# Patient Record
Sex: Female | Born: 1940 | Race: White | Hispanic: No | Smoking: Never smoker
Health system: Southern US, Community
[De-identification: ages and names within clinical notes are randomized; demographics above are authoritative.]

## PROBLEM LIST (undated history)

## (undated) DIAGNOSIS — E119 Type 2 diabetes mellitus without complications: Secondary | ICD-10-CM

## (undated) DIAGNOSIS — E785 Hyperlipidemia, unspecified: Secondary | ICD-10-CM

## (undated) DIAGNOSIS — Z90711 Acquired absence of uterus with remaining cervical stump: Secondary | ICD-10-CM

## (undated) DIAGNOSIS — I10 Essential (primary) hypertension: Secondary | ICD-10-CM

## (undated) DIAGNOSIS — M1990 Unspecified osteoarthritis, unspecified site: Secondary | ICD-10-CM

## (undated) HISTORY — PX: COLONOSCOPY: SHX174

## (undated) HISTORY — DX: Acquired absence of uterus with remaining cervical stump: Z90.711

## (undated) HISTORY — DX: Hyperlipidemia, unspecified: E78.5

## (undated) HISTORY — DX: Type 2 diabetes mellitus without complications: E11.9

## (undated) HISTORY — DX: Unspecified osteoarthritis, unspecified site: M19.90

## (undated) HISTORY — PX: PARTIAL HYSTERECTOMY: SHX80

## (undated) HISTORY — PX: CHOLECYSTECTOMY: SHX55

## (undated) HISTORY — DX: Essential (primary) hypertension: I10

---

## 2013-10-19 DIAGNOSIS — L270 Generalized skin eruption due to drugs and medicaments taken internally: Secondary | ICD-10-CM | POA: Diagnosis not present

## 2013-10-19 DIAGNOSIS — L820 Inflamed seborrheic keratosis: Secondary | ICD-10-CM | POA: Diagnosis not present

## 2013-10-19 DIAGNOSIS — D485 Neoplasm of uncertain behavior of skin: Secondary | ICD-10-CM | POA: Diagnosis not present

## 2013-10-19 DIAGNOSIS — L821 Other seborrheic keratosis: Secondary | ICD-10-CM | POA: Diagnosis not present

## 2014-02-23 DIAGNOSIS — E119 Type 2 diabetes mellitus without complications: Secondary | ICD-10-CM | POA: Diagnosis not present

## 2014-02-23 DIAGNOSIS — H353190 Nonexudative age-related macular degeneration, unspecified eye, stage unspecified: Secondary | ICD-10-CM | POA: Diagnosis not present

## 2014-03-29 DIAGNOSIS — H2510 Age-related nuclear cataract, unspecified eye: Secondary | ICD-10-CM | POA: Diagnosis not present

## 2014-03-29 DIAGNOSIS — E119 Type 2 diabetes mellitus without complications: Secondary | ICD-10-CM | POA: Diagnosis not present

## 2014-03-29 DIAGNOSIS — H353190 Nonexudative age-related macular degeneration, unspecified eye, stage unspecified: Secondary | ICD-10-CM | POA: Diagnosis not present

## 2014-03-29 DIAGNOSIS — E1139 Type 2 diabetes mellitus with other diabetic ophthalmic complication: Secondary | ICD-10-CM | POA: Diagnosis not present

## 2014-05-30 DIAGNOSIS — Z130 Encounter for screening for diseases of the blood and blood-forming organs and certain disorders involving the immune mechanism: Secondary | ICD-10-CM | POA: Diagnosis not present

## 2014-05-30 DIAGNOSIS — Z1329 Encounter for screening for other suspected endocrine disorder: Secondary | ICD-10-CM | POA: Diagnosis not present

## 2014-05-30 DIAGNOSIS — E785 Hyperlipidemia, unspecified: Secondary | ICD-10-CM | POA: Diagnosis not present

## 2014-05-30 DIAGNOSIS — E119 Type 2 diabetes mellitus without complications: Secondary | ICD-10-CM | POA: Diagnosis not present

## 2014-05-30 DIAGNOSIS — Z13228 Encounter for screening for other metabolic disorders: Secondary | ICD-10-CM | POA: Diagnosis not present

## 2014-05-30 DIAGNOSIS — I10 Essential (primary) hypertension: Secondary | ICD-10-CM | POA: Diagnosis not present

## 2014-06-02 DIAGNOSIS — M545 Low back pain, unspecified: Secondary | ICD-10-CM | POA: Diagnosis not present

## 2014-06-02 DIAGNOSIS — M1610 Unilateral primary osteoarthritis, unspecified hip: Secondary | ICD-10-CM | POA: Diagnosis not present

## 2014-06-02 DIAGNOSIS — M25559 Pain in unspecified hip: Secondary | ICD-10-CM | POA: Diagnosis not present

## 2014-07-12 DIAGNOSIS — H25813 Combined forms of age-related cataract, bilateral: Secondary | ICD-10-CM | POA: Diagnosis not present

## 2014-07-12 DIAGNOSIS — H3532 Exudative age-related macular degeneration: Secondary | ICD-10-CM | POA: Diagnosis not present

## 2014-07-12 DIAGNOSIS — H353290 Exudative age-related macular degeneration, unspecified eye, stage unspecified: Secondary | ICD-10-CM | POA: Insufficient documentation

## 2014-07-13 DIAGNOSIS — L728 Other follicular cysts of the skin and subcutaneous tissue: Secondary | ICD-10-CM | POA: Diagnosis not present

## 2014-07-25 DIAGNOSIS — M25559 Pain in unspecified hip: Secondary | ICD-10-CM | POA: Insufficient documentation

## 2014-07-25 DIAGNOSIS — M1610 Unilateral primary osteoarthritis, unspecified hip: Secondary | ICD-10-CM | POA: Insufficient documentation

## 2014-07-25 DIAGNOSIS — E119 Type 2 diabetes mellitus without complications: Secondary | ICD-10-CM | POA: Insufficient documentation

## 2014-08-08 DIAGNOSIS — D126 Benign neoplasm of colon, unspecified: Secondary | ICD-10-CM | POA: Diagnosis not present

## 2014-08-08 DIAGNOSIS — Z79899 Other long term (current) drug therapy: Secondary | ICD-10-CM | POA: Diagnosis not present

## 2014-08-08 DIAGNOSIS — Z90710 Acquired absence of both cervix and uterus: Secondary | ICD-10-CM | POA: Diagnosis not present

## 2014-08-08 DIAGNOSIS — Z1211 Encounter for screening for malignant neoplasm of colon: Secondary | ICD-10-CM | POA: Diagnosis not present

## 2014-08-08 DIAGNOSIS — Z86010 Personal history of colonic polyps: Secondary | ICD-10-CM | POA: Diagnosis not present

## 2014-08-08 DIAGNOSIS — D122 Benign neoplasm of ascending colon: Secondary | ICD-10-CM | POA: Diagnosis not present

## 2014-08-08 DIAGNOSIS — Z9049 Acquired absence of other specified parts of digestive tract: Secondary | ICD-10-CM | POA: Diagnosis not present

## 2014-08-08 DIAGNOSIS — Z882 Allergy status to sulfonamides status: Secondary | ICD-10-CM | POA: Diagnosis not present

## 2014-08-08 DIAGNOSIS — Z880 Allergy status to penicillin: Secondary | ICD-10-CM | POA: Diagnosis not present

## 2014-08-08 DIAGNOSIS — D123 Benign neoplasm of transverse colon: Secondary | ICD-10-CM | POA: Diagnosis not present

## 2014-08-08 DIAGNOSIS — K6289 Other specified diseases of anus and rectum: Secondary | ICD-10-CM | POA: Diagnosis not present

## 2014-08-08 DIAGNOSIS — K5730 Diverticulosis of large intestine without perforation or abscess without bleeding: Secondary | ICD-10-CM | POA: Diagnosis not present

## 2014-08-08 DIAGNOSIS — M25559 Pain in unspecified hip: Secondary | ICD-10-CM | POA: Diagnosis not present

## 2014-08-08 DIAGNOSIS — Z881 Allergy status to other antibiotic agents status: Secondary | ICD-10-CM | POA: Diagnosis not present

## 2014-08-08 DIAGNOSIS — E119 Type 2 diabetes mellitus without complications: Secondary | ICD-10-CM | POA: Diagnosis not present

## 2014-08-08 DIAGNOSIS — H3532 Exudative age-related macular degeneration: Secondary | ICD-10-CM | POA: Diagnosis not present

## 2014-08-24 DIAGNOSIS — H2510 Age-related nuclear cataract, unspecified eye: Secondary | ICD-10-CM | POA: Diagnosis not present

## 2014-08-24 DIAGNOSIS — H2511 Age-related nuclear cataract, right eye: Secondary | ICD-10-CM | POA: Diagnosis not present

## 2014-08-24 DIAGNOSIS — H52221 Regular astigmatism, right eye: Secondary | ICD-10-CM | POA: Diagnosis not present

## 2014-09-13 DIAGNOSIS — H25812 Combined forms of age-related cataract, left eye: Secondary | ICD-10-CM | POA: Insufficient documentation

## 2014-10-18 DIAGNOSIS — Z08 Encounter for follow-up examination after completed treatment for malignant neoplasm: Secondary | ICD-10-CM | POA: Diagnosis not present

## 2014-10-18 DIAGNOSIS — Z85828 Personal history of other malignant neoplasm of skin: Secondary | ICD-10-CM | POA: Diagnosis not present

## 2014-10-18 DIAGNOSIS — L821 Other seborrheic keratosis: Secondary | ICD-10-CM | POA: Diagnosis not present

## 2014-10-18 DIAGNOSIS — L720 Epidermal cyst: Secondary | ICD-10-CM | POA: Diagnosis not present

## 2014-12-28 DIAGNOSIS — M5136 Other intervertebral disc degeneration, lumbar region: Secondary | ICD-10-CM | POA: Diagnosis not present

## 2014-12-28 DIAGNOSIS — M160 Bilateral primary osteoarthritis of hip: Secondary | ICD-10-CM | POA: Diagnosis not present

## 2015-02-01 DIAGNOSIS — I10 Essential (primary) hypertension: Secondary | ICD-10-CM | POA: Diagnosis not present

## 2015-02-01 DIAGNOSIS — E119 Type 2 diabetes mellitus without complications: Secondary | ICD-10-CM | POA: Diagnosis not present

## 2015-02-20 DIAGNOSIS — I10 Essential (primary) hypertension: Secondary | ICD-10-CM | POA: Diagnosis not present

## 2015-02-20 DIAGNOSIS — E119 Type 2 diabetes mellitus without complications: Secondary | ICD-10-CM | POA: Diagnosis not present

## 2015-05-24 DIAGNOSIS — H353190 Nonexudative age-related macular degeneration, unspecified eye, stage unspecified: Secondary | ICD-10-CM | POA: Insufficient documentation

## 2015-05-24 DIAGNOSIS — H3532 Exudative age-related macular degeneration: Secondary | ICD-10-CM | POA: Diagnosis not present

## 2015-05-24 DIAGNOSIS — H25812 Combined forms of age-related cataract, left eye: Secondary | ICD-10-CM | POA: Diagnosis not present

## 2015-05-24 DIAGNOSIS — Z961 Presence of intraocular lens: Secondary | ICD-10-CM | POA: Insufficient documentation

## 2015-05-24 DIAGNOSIS — H3531 Nonexudative age-related macular degeneration: Secondary | ICD-10-CM | POA: Diagnosis not present

## 2015-07-06 ENCOUNTER — Encounter: Payer: Self-pay | Admitting: Internal Medicine

## 2015-07-06 DIAGNOSIS — E1165 Type 2 diabetes mellitus with hyperglycemia: Secondary | ICD-10-CM

## 2015-07-06 DIAGNOSIS — K635 Polyp of colon: Secondary | ICD-10-CM | POA: Insufficient documentation

## 2015-07-06 DIAGNOSIS — I10 Essential (primary) hypertension: Secondary | ICD-10-CM | POA: Insufficient documentation

## 2015-07-06 DIAGNOSIS — IMO0002 Reserved for concepts with insufficient information to code with codable children: Secondary | ICD-10-CM | POA: Insufficient documentation

## 2015-07-06 DIAGNOSIS — E785 Hyperlipidemia, unspecified: Secondary | ICD-10-CM | POA: Insufficient documentation

## 2015-07-06 DIAGNOSIS — E1140 Type 2 diabetes mellitus with diabetic neuropathy, unspecified: Secondary | ICD-10-CM | POA: Insufficient documentation

## 2015-07-08 ENCOUNTER — Other Ambulatory Visit: Payer: Self-pay | Admitting: Internal Medicine

## 2015-07-08 ENCOUNTER — Encounter: Payer: Self-pay | Admitting: Internal Medicine

## 2015-07-08 DIAGNOSIS — Z5320 Procedure and treatment not carried out because of patient's decision for unspecified reasons: Secondary | ICD-10-CM | POA: Insufficient documentation

## 2015-07-09 ENCOUNTER — Ambulatory Visit: Payer: Self-pay | Admitting: Internal Medicine

## 2015-07-10 ENCOUNTER — Ambulatory Visit: Payer: Self-pay | Admitting: Family Medicine

## 2015-07-11 ENCOUNTER — Encounter: Payer: Self-pay | Admitting: Family Medicine

## 2015-07-11 ENCOUNTER — Ambulatory Visit (INDEPENDENT_AMBULATORY_CARE_PROVIDER_SITE_OTHER): Payer: Medicare Other | Admitting: Family Medicine

## 2015-07-11 VITALS — BP 120/80 | HR 68 | Ht 63.0 in | Wt 165.0 lb

## 2015-07-11 DIAGNOSIS — Z23 Encounter for immunization: Secondary | ICD-10-CM | POA: Diagnosis not present

## 2015-07-11 DIAGNOSIS — E1165 Type 2 diabetes mellitus with hyperglycemia: Secondary | ICD-10-CM | POA: Diagnosis not present

## 2015-07-11 DIAGNOSIS — E1140 Type 2 diabetes mellitus with diabetic neuropathy, unspecified: Secondary | ICD-10-CM

## 2015-07-11 DIAGNOSIS — K589 Irritable bowel syndrome without diarrhea: Secondary | ICD-10-CM | POA: Insufficient documentation

## 2015-07-11 DIAGNOSIS — E559 Vitamin D deficiency, unspecified: Secondary | ICD-10-CM | POA: Diagnosis not present

## 2015-07-11 DIAGNOSIS — E785 Hyperlipidemia, unspecified: Secondary | ICD-10-CM | POA: Diagnosis not present

## 2015-07-11 DIAGNOSIS — IMO0002 Reserved for concepts with insufficient information to code with codable children: Secondary | ICD-10-CM

## 2015-07-11 DIAGNOSIS — Z7189 Other specified counseling: Secondary | ICD-10-CM

## 2015-07-11 DIAGNOSIS — E538 Deficiency of other specified B group vitamins: Secondary | ICD-10-CM | POA: Diagnosis not present

## 2015-07-11 DIAGNOSIS — H353290 Exudative age-related macular degeneration, unspecified eye, stage unspecified: Secondary | ICD-10-CM

## 2015-07-11 DIAGNOSIS — I10 Essential (primary) hypertension: Secondary | ICD-10-CM

## 2015-07-11 NOTE — Progress Notes (Signed)
Date:  07/11/2015   Name:  Betty Ibarra   DOB:  1941-04-04   MRN:  [id number]  PCP:  Adline Potter, MD    Chief Complaint: Establish Care   History of Present Illness:  This is a 74 y.o. female with known T2DM and HTN who has been off her glipizide for several months and lisinopril for two weeks. Last a1c in May was 10.2%. Notes numbness and tingling of her feet she relates to diabetes. Husband died in 2013-01-07 and pt moved to own apt in 10-May-2023. Father died with heart dz and mother died with lymphoma and CVA. Weight up 9# since last PCP visit in May when CMP/CBC/TSH/lipids were unremarkable except for hyperglycemia and LDL 135. Tetanus status unknown, declines flu or pneumococcal imms. Unsure about zoster imm. Had colonoscopy 01-07-14 with benign polyp. Hx IBS with occ diarrhea. Also has MD followed by optho.  Review of Systems:  Review of Systems  Constitutional: Negative for fever and chills.  HENT: Negative for ear pain, sore throat and trouble swallowing.   Eyes: Negative for pain.  Respiratory: Negative for cough and shortness of breath.   Cardiovascular: Negative for chest pain and leg swelling.  Gastrointestinal: Negative for abdominal pain.  Endocrine: Negative for polyuria.  Genitourinary: Negative for difficulty urinating.  Neurological: Negative for syncope and light-headedness.  Hematological: Negative for adenopathy.    Patient Active Problem List   Diagnosis Date Noted  . IBS (irritable bowel syndrome) 07/11/2015  . Patient refusal of treatment 07/08/2015  . Type 2 diabetes mellitus, uncontrolled, with neuropathy ([place]) 07/06/2015  . Hyperlipidemia 07/06/2015  . Hypertension 07/06/2015  . Colon polyp 07/06/2015  . Arthralgia of hip 07/25/2014  . 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 Sulfa Antibiotics Itching and Rash  . Penicillins Rash    Past Surgical  History  Procedure Laterality Date  . Partial hysterectomy      one ovary remains  . Cholecystectomy    . Colonoscopy  2010, 2015    one polyp    Social History  Substance Use Topics  . Smoking status: Never Smoker   . Smokeless tobacco: None  . Alcohol Use: 1.2 oz/week    2 Standard drinks or equivalent per week    Family History  Problem Relation Age of Onset  . Diabetes Father   . Colon cancer Mother     Medication list has been reviewed and updated.  Physical Examination: BP 120/80 mmHg  Pulse 68  Ht 5\' 3"  (1.6 m)  Wt 165 lb (74.844 kg)  BMI 29.24 kg/m2  Physical Exam  Constitutional: She is oriented to person, place, and time. She appears well-developed and well-nourished.  HENT:  Head: Normocephalic and atraumatic.  Right Ear: External ear normal.  Left Ear: External ear normal.  Nose: Nose normal.  Mouth/Throat: Oropharynx is clear and moist.  Eyes: Conjunctivae and EOM are normal. Pupils are equal, round, and reactive to light.  Neck: Neck supple. No thyromegaly present.  Cardiovascular: Normal rate, regular rhythm and normal heart sounds.   Pulmonary/Chest: Effort normal and breath sounds normal.  Abdominal: Soft. She exhibits no distension and no mass. There is no tenderness.  Musculoskeletal: She exhibits no edema.  Lymphadenopathy:    She has no cervical adenopathy.  Neurological: She is alert and oriented to person, place, and time. Coordination normal.  Skin: Skin is warm and  dry.  Psychiatric: She has a normal mood and affect. Her behavior is normal.  Nursing note and vitals reviewed.   Assessment and Plan:  1. Type 2 diabetes mellitus, uncontrolled, with neuropathy (HCC) FSBG 232 today, pt would like to avoid restarting meds and try lifestyle changes for next 6 weeks - Comprehensive metabolic panel - HgB Z3P - Urine Microalbumin w/creat. ratio - Vitamin D (25 hydroxy) - B12  2. Hyperlipidemia With DM LDL goal < 100, will check baseline  today - Lipid Profile  3. Essential hypertension BP ok off lisinopril (may have been taking for elevated MCR), monitor off med for now  4. 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 Advance care planning Requests no resuscitation in case of CP arrest, DNR order discussed, three copies completed, two given to patient  Return in about 6 weeks (around 08/22/2015).  Satira Anis. [facility]  07/11/2015

## 2015-07-11 NOTE — Addendum Note (Signed)
Addended by: Adline Potter on: 07/11/2015 05:06 PM   Modules accepted: Medications

## 2015-07-12 DIAGNOSIS — E538 Deficiency of other specified B group vitamins: Secondary | ICD-10-CM | POA: Insufficient documentation

## 2015-07-12 DIAGNOSIS — E559 Vitamin D deficiency, unspecified: Secondary | ICD-10-CM | POA: Insufficient documentation

## 2015-07-12 LAB — LIPID PANEL
CHOL/HDL RATIO: 6.1 ratio — AB (ref 0.0–4.4)
Cholesterol, Total: 269 mg/dL — ABNORMAL HIGH (ref 100–199)
HDL: 44 mg/dL (ref >39)
TRIGLYCERIDES: 516 mg/dL — AB (ref 0–149)

## 2015-07-12 LAB — COMPREHENSIVE METABOLIC PANEL
A/G RATIO: 1.5 (ref 1.1–2.5)
ALT: 16 IU/L (ref 0–32)
AST: 15 IU/L (ref 0–40)
Albumin: 4 g/dL (ref 3.5–4.8)
Alkaline Phosphatase: 94 IU/L (ref 39–117)
BUN/Creatinine Ratio: 20 (ref 11–26)
BUN: 18 mg/dL (ref 8–27)
CALCIUM: 8.9 mg/dL (ref 8.7–10.3)
CHLORIDE: 93 mmol/L — AB (ref 97–106)
CO2: 24 mmol/L (ref 18–29)
Creatinine, Ser: 0.92 mg/dL (ref 0.57–1.00)
GFR calc non Af Amer: 61 mL/min/{1.73_m2} (ref >59)
GFR, EST AFRICAN AMERICAN: 70 mL/min/{1.73_m2} (ref >59)
GLUCOSE: 214 mg/dL — AB (ref 65–99)
Globulin, Total: 2.7 g/dL (ref 1.5–4.5)
POTASSIUM: 4.2 mmol/L (ref 3.5–5.2)
Sodium: 136 mmol/L (ref 136–144)
TOTAL PROTEIN: 6.7 g/dL (ref 6.0–8.5)

## 2015-07-12 LAB — VITAMIN B12: VITAMIN B 12: 299 pg/mL (ref 211–946)

## 2015-07-12 LAB — HEMOGLOBIN A1C
ESTIMATED AVERAGE GLUCOSE: 280 mg/dL
HEMOGLOBIN A1C: 11.4 % — AB (ref 4.8–5.6)

## 2015-07-12 LAB — VITAMIN D 25 HYDROXY (VIT D DEFICIENCY, FRACTURES): Vit D, 25-Hydroxy: 14.5 ng/mL — ABNORMAL LOW (ref 30.0–100.0)

## 2015-07-12 LAB — MICROALBUMIN / CREATININE URINE RATIO

## 2015-07-12 MED ORDER — VITAMIN B-12 1000 MCG PO TABS: 1000.0000 ug | ORAL_TABLET | Freq: Every day | ORAL | Status: DC

## 2015-07-12 MED ORDER — METFORMIN HCL 500 MG PO TABS: 500.0000 mg | ORAL_TABLET | Freq: Two times a day (BID) | ORAL | Status: DC

## 2015-07-12 MED ORDER — VITAMIN D 50 MCG (2000 UT) PO CAPS: 1.0000 | ORAL_CAPSULE | Freq: Every day | ORAL | Status: DC

## 2015-07-12 MED ORDER — GLIPIZIDE 5 MG PO TABS
5.0000 mg | ORAL_TABLET | Freq: Two times a day (BID) | ORAL | Status: DC
Start: 2015-07-12 — End: 2015-10-24

## 2015-07-12 NOTE — Addendum Note (Signed)
Addended by: Adline Potter on: 07/12/2015 09:48 AM   Modules accepted: Orders

## 2015-07-17 ENCOUNTER — Other Ambulatory Visit: Payer: Self-pay

## 2015-08-22 ENCOUNTER — Ambulatory Visit: Payer: Medicare Other | Admitting: Family Medicine

## 2015-09-24 ENCOUNTER — Ambulatory Visit: Payer: Medicare Other | Admitting: Family Medicine

## 2015-10-24 ENCOUNTER — Other Ambulatory Visit: Payer: Self-pay | Admitting: Family Medicine

## 2015-10-24 ENCOUNTER — Telehealth: Payer: Self-pay

## 2015-10-24 MED ORDER — GLIPIZIDE 5 MG PO TABS: 5.0000 mg | ORAL_TABLET | Freq: Two times a day (BID) | ORAL | Status: DC

## 2015-10-24 MED ORDER — METFORMIN HCL 500 MG PO TABS: 500.0000 mg | ORAL_TABLET | Freq: Two times a day (BID) | ORAL | Status: DC

## 2015-10-24 NOTE — Telephone Encounter (Signed)
Sent to [place] 

## 2015-11-02 ENCOUNTER — Telehealth: Payer: Self-pay

## 2015-11-02 ENCOUNTER — Other Ambulatory Visit: Payer: Self-pay | Admitting: Family Medicine

## 2015-11-02 ENCOUNTER — Encounter: Payer: Self-pay | Admitting: Family Medicine

## 2015-11-02 ENCOUNTER — Ambulatory Visit (INDEPENDENT_AMBULATORY_CARE_PROVIDER_SITE_OTHER): Payer: Medicare Other | Admitting: Family Medicine

## 2015-11-02 ENCOUNTER — Other Ambulatory Visit: Payer: Self-pay

## 2015-11-02 VITALS — BP 162/82 | HR 66 | Resp 16 | Ht 63.0 in | Wt 166.0 lb

## 2015-11-02 DIAGNOSIS — I10 Essential (primary) hypertension: Secondary | ICD-10-CM | POA: Diagnosis not present

## 2015-11-02 DIAGNOSIS — E785 Hyperlipidemia, unspecified: Secondary | ICD-10-CM

## 2015-11-02 DIAGNOSIS — E1140 Type 2 diabetes mellitus with diabetic neuropathy, unspecified: Secondary | ICD-10-CM | POA: Diagnosis not present

## 2015-11-02 DIAGNOSIS — E559 Vitamin D deficiency, unspecified: Secondary | ICD-10-CM

## 2015-11-02 DIAGNOSIS — E538 Deficiency of other specified B group vitamins: Secondary | ICD-10-CM

## 2015-11-02 DIAGNOSIS — IMO0002 Reserved for concepts with insufficient information to code with codable children: Secondary | ICD-10-CM

## 2015-11-02 DIAGNOSIS — E1165 Type 2 diabetes mellitus with hyperglycemia: Secondary | ICD-10-CM

## 2015-11-02 MED ORDER — LISINOPRIL 5 MG PO TABS: 5.0000 mg | ORAL_TABLET | Freq: Every day | ORAL | Status: DC

## 2015-11-02 NOTE — Progress Notes (Signed)
Date:  11/02/2015   Name:  Betty Ibarra   DOB:  03/08/1941   MRN:  [id number]  PCP:  Adline Potter, MD    Chief Complaint: Diabetes   History of Present Illness:  This is a 75 y.o. female for follow up from initial visit in October. A1c at that time 11.4% and restarted on glipizide and metformin added. Reports am BG's have improved from 200 to 160 on average and neuropathy sxs improved. Some initial bloating but that also improved. Taking vit D and B12 supplements daily. Hx HTN, tolerated lisinopril in past. No new concerns.  Review of Systems:  Review of Systems  Constitutional: Negative for fever and fatigue.  Respiratory: Negative for shortness of breath.   Cardiovascular: Negative for chest pain and leg swelling.  Endocrine: Negative for polyuria.  Genitourinary: Negative for difficulty urinating.  Neurological: Negative for syncope and light-headedness.    Patient Active Problem List   Diagnosis Date Noted  . Vitamin D deficiency 07/12/2015  . Vitamin B12 deficiency 07/12/2015  . IBS (irritable bowel syndrome) 07/11/2015  . Type 2 diabetes mellitus, uncontrolled, with neuropathy ([place]) 07/06/2015  . Hyperlipidemia 07/06/2015  . Hypertension 07/06/2015  . Colon polyp 07/06/2015  . Arthralgia of hip 07/25/2014  . 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 Authorizing Provider  Cholecalciferol (VITAMIN D) 2000 UNITS CAPS Take 1 capsule (2,000 Units total) by mouth daily. 07/12/15  Yes Adline Potter, MD  glipiZIDE (GLUCOTROL) 5 MG tablet Take 1 tablet (5 mg total) by mouth 2 (two) times daily before a meal. 10/24/15  Yes Adline Potter, MD  metFORMIN (GLUCOPHAGE) 500 MG tablet Take 1 tablet (500 mg total) by mouth 2 (two) times daily with a meal. 10/24/15  Yes Adline Potter, MD  vitamin B-12 (CYANOCOBALAMIN) 1000 MCG tablet Take 1 tablet (1,000 mcg total) by mouth daily. 07/12/15   Yes Adline Potter, MD  lisinopril (PRINIVIL,ZESTRIL) 5 MG tablet Take 1 tablet (5 mg total) by mouth daily. 11/02/15   Adline Potter, MD    Allergies  Allergen Reactions  . Sulfa Antibiotics Itching and Rash  . Penicillins Rash    Past Surgical History  Procedure Laterality Date  . Partial hysterectomy      one ovary remains  . Cholecystectomy    . Colonoscopy  2010, 2015    one polyp    Social History  Substance Use Topics  . Smoking status: Never Smoker   . Smokeless tobacco: None  . Alcohol Use: 1.2 oz/week    2 Standard drinks or equivalent per week    Family History  Problem Relation Age of Onset  . Diabetes Father   . Colon cancer Mother     Medication list has been reviewed and updated.  Physical Examination: BP 162/82 mmHg  Pulse 66  Resp 16  Ht 5\' 3"  (1.6 m)  Wt 166 lb (75.297 kg)  BMI 29.41 kg/m2  SpO2 99%  Physical Exam  Constitutional: She appears well-developed and well-nourished.  Cardiovascular: Normal rate, regular rhythm and normal heart sounds.   Pulmonary/Chest: Effort normal and breath sounds normal.  Musculoskeletal: She exhibits no edema.  Neurological: She is alert.  Skin: Skin is warm and dry.  Psychiatric: She has a normal mood and affect. Her behavior is normal.  Nursing note and vitals reviewed.   Assessment and Plan:  1. Type 2 diabetes mellitus, uncontrolled, with neuropathy ([place]) Sxs improved on  glipizide/metformin, recheck a1c, consider MCR and optho ref next visit - HgB A1c - TSH  2. Essential hypertension Restart lisinopril 5 mg daily  3. Hyperlipidemia Recheck with improved DM control - Lipid Profile  4. Vitamin B12 deficiency On supplement - B12  5. Vitamin D deficiency On supplement - Vitamin D (25 hydroxy)  Return in about 4 weeks (around 11/30/2015).  Satira Anis. [facility]  11/02/2015

## 2015-11-02 NOTE — Telephone Encounter (Signed)
Not on med list, please call pt to find out which needles/strips she uses and call refills into pharmacy.

## 2015-11-02 NOTE — Telephone Encounter (Signed)
Patient needs strips and needles called in [place].

## 2015-11-03 LAB — HEMOGLOBIN A1C
ESTIMATED AVERAGE GLUCOSE: 203 mg/dL
HEMOGLOBIN A1C: 8.7 % — AB (ref 4.8–5.6)

## 2015-11-03 LAB — LIPID PANEL
CHOL/HDL RATIO: 6 ratio — AB (ref 0.0–4.4)
Cholesterol, Total: 244 mg/dL — ABNORMAL HIGH (ref 100–199)
HDL: 41 mg/dL (ref >39)
LDL CALC: 139 mg/dL — AB (ref 0–99)
TRIGLYCERIDES: 322 mg/dL — AB (ref 0–149)
VLDL Cholesterol Cal: 64 mg/dL — ABNORMAL HIGH (ref 5–40)

## 2015-11-03 LAB — VITAMIN B12: Vitamin B-12: 1506 pg/mL — ABNORMAL HIGH (ref 211–946)

## 2015-11-03 LAB — TSH: TSH: 2.62 u[IU]/mL (ref 0.450–4.500)

## 2015-11-03 LAB — VITAMIN D 25 HYDROXY (VIT D DEFICIENCY, FRACTURES): Vit D, 25-Hydroxy: 25.5 ng/mL — ABNORMAL LOW (ref 30.0–100.0)

## 2015-11-05 ENCOUNTER — Other Ambulatory Visit: Payer: Self-pay

## 2015-11-05 ENCOUNTER — Other Ambulatory Visit: Payer: Self-pay | Admitting: Family Medicine

## 2015-11-05 MED ORDER — VITAMIN D 50 MCG (2000 UT) PO CAPS: 2.0000 | ORAL_CAPSULE | Freq: Every day | ORAL | Status: DC

## 2015-11-05 MED ORDER — METFORMIN HCL 1000 MG PO TABS: 1000.0000 mg | ORAL_TABLET | Freq: Two times a day (BID) | ORAL | Status: DC

## 2015-11-05 NOTE — Telephone Encounter (Signed)
Received fax from pharmacy requesting 90 day supply.

## 2015-11-05 NOTE — Addendum Note (Signed)
Addended by: Adline Potter on: 11/05/2015 10:05 AM   Modules accepted: Orders, Medications

## 2015-11-07 ENCOUNTER — Other Ambulatory Visit: Payer: Self-pay | Admitting: Family Medicine

## 2015-11-07 ENCOUNTER — Other Ambulatory Visit: Payer: Self-pay

## 2015-11-07 DIAGNOSIS — E119 Type 2 diabetes mellitus without complications: Secondary | ICD-10-CM

## 2015-11-07 MED ORDER — GLUCOSE BLOOD VI STRP: ORAL_STRIP | Status: DC

## 2015-11-07 MED ORDER — ONETOUCH ULTRASOFT LANCETS MISC: Status: DC

## 2015-11-07 MED ORDER — METFORMIN HCL 500 MG PO TABS: 500.0000 mg | ORAL_TABLET | Freq: Two times a day (BID) | ORAL | Status: DC

## 2015-11-14 DIAGNOSIS — L821 Other seborrheic keratosis: Secondary | ICD-10-CM | POA: Diagnosis not present

## 2015-11-14 DIAGNOSIS — Z85828 Personal history of other malignant neoplasm of skin: Secondary | ICD-10-CM | POA: Diagnosis not present

## 2015-11-14 DIAGNOSIS — L720 Epidermal cyst: Secondary | ICD-10-CM | POA: Diagnosis not present

## 2015-11-14 DIAGNOSIS — Z08 Encounter for follow-up examination after completed treatment for malignant neoplasm: Secondary | ICD-10-CM | POA: Diagnosis not present

## 2015-11-30 ENCOUNTER — Ambulatory Visit: Payer: Medicare Other | Admitting: Family Medicine

## 2015-12-07 ENCOUNTER — Ambulatory Visit (INDEPENDENT_AMBULATORY_CARE_PROVIDER_SITE_OTHER): Payer: Medicare Other | Admitting: Family Medicine

## 2015-12-07 ENCOUNTER — Encounter: Payer: Self-pay | Admitting: Family Medicine

## 2015-12-07 VITALS — BP 158/64 | HR 70 | Ht 63.0 in | Wt 167.2 lb

## 2015-12-07 DIAGNOSIS — E559 Vitamin D deficiency, unspecified: Secondary | ICD-10-CM | POA: Diagnosis not present

## 2015-12-07 DIAGNOSIS — E1140 Type 2 diabetes mellitus with diabetic neuropathy, unspecified: Secondary | ICD-10-CM | POA: Diagnosis not present

## 2015-12-07 DIAGNOSIS — I10 Essential (primary) hypertension: Secondary | ICD-10-CM | POA: Diagnosis not present

## 2015-12-07 DIAGNOSIS — E538 Deficiency of other specified B group vitamins: Secondary | ICD-10-CM

## 2015-12-07 DIAGNOSIS — E785 Hyperlipidemia, unspecified: Secondary | ICD-10-CM | POA: Diagnosis not present

## 2015-12-07 DIAGNOSIS — IMO0002 Reserved for concepts with insufficient information to code with codable children: Secondary | ICD-10-CM

## 2015-12-07 DIAGNOSIS — E1165 Type 2 diabetes mellitus with hyperglycemia: Secondary | ICD-10-CM

## 2015-12-07 MED ORDER — METFORMIN HCL 1000 MG PO TABS: ORAL_TABLET | ORAL | Status: DC

## 2015-12-07 NOTE — Progress Notes (Signed)
Date:  12/07/2015   Name:  Betty Ibarra   DOB:  [birth year]   MRN:  [id number]  PCP:  Adline Potter, MD    Chief Complaint: Follow-up and Diabetes   History of Present Illness:  This is a 75 y.o. female for one month f/u, started on lisinopril 5 mg daily last visit for BP 162/82, tolerating well, blood work showed a1c 8.7%, advised to increase metformin to 1000 mg bid but reluctant to do so, thinks makes glucoses too low, has had two low blood sugar readings this month, wonders if needs to be on two diabetic meds. Does take 1000 mg occ when eating more. Would like to see diabetic educator or nutritionist in [place] to discuss diet and weight loss. Last optho 6 months ago. Taking B12 supp and increased vit D supp. Needs MCR. Feeling more bloated lately but weight stable. Neuropathy sxs remain improved.  Review of Systems:  Review of Systems  Constitutional: Negative for fever.  Respiratory: Negative for cough and shortness of breath.   Cardiovascular: Negative for chest pain and leg swelling.  Endocrine: Negative for polyuria.  Genitourinary: Negative for difficulty urinating.  Neurological: Negative for syncope and light-headedness.    Patient Active Problem List   Diagnosis Date Noted  . Vitamin D deficiency 07/12/2015  . Vitamin B12 deficiency 07/12/2015  . IBS (irritable bowel syndrome) 07/11/2015  . Type 2 diabetes mellitus, uncontrolled, with neuropathy ([place]) 07/06/2015  . Hyperlipidemia 07/06/2015  . Hypertension 07/06/2015  . Colon polyp 07/06/2015  . Arthralgia of hip 07/25/2014  . 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 Authorizing Provider  Cholecalciferol (VITAMIN D) 2000 units CAPS Take 2 capsules (4,000 Units total) by mouth daily. 11/05/15  Yes Adline Potter, MD  glipiZIDE (GLUCOTROL) 5 MG tablet Take 1 tablet (5 mg total) by mouth 2 (two) times daily before a  meal. 10/24/15  Yes Adline Potter, MD  glucose blood test strip Use as instructed 11/07/15  Yes Adline Potter, MD  Lancets [facility] ULTRASOFT) lancets Use as instructed 11/07/15  Yes Adline Potter, MD  lisinopril (PRINIVIL,ZESTRIL) 5 MG tablet Take 1 tablet (5 mg total) by mouth daily. 11/02/15  Yes Adline Potter, MD  metFORMIN (GLUCOPHAGE) 1000 MG tablet Take 1 tablet by mouth 2 (two) times daily. 11/05/15  Yes Historical Provider, MD  vitamin B-12 (CYANOCOBALAMIN) 1000 MCG tablet Take 1 tablet (1,000 mcg total) by mouth daily. 07/12/15  Yes Adline Potter, MD    Allergies  Allergen Reactions  . Sulfa Antibiotics Itching and Rash  . Penicillins Rash    Past Surgical History  Procedure Laterality Date  . Partial hysterectomy      one ovary remains  . Cholecystectomy    . Colonoscopy  2010, 2015    one polyp    Social History  Substance Use Topics  . Smoking status: Never Smoker   . Smokeless tobacco: None  . Alcohol Use: 1.2 oz/week    2 Standard drinks or equivalent per week    Family History  Problem Relation Age of Onset  . Diabetes Father   . Colon cancer Mother     Medication list has been reviewed and updated.  Physical Examination: BP 158/64 mmHg  Pulse 70  Ht 5\' 3"  (1.6 m)  Wt 167 lb 3.2 oz (75.841 kg)  BMI 29.63 kg/m2  Physical Exam  Constitutional: She appears well-developed and well-nourished.  Cardiovascular: Normal rate,  regular rhythm and normal heart sounds.   Pulmonary/Chest: Effort normal and breath sounds normal.  Musculoskeletal: She exhibits no edema.  Neurological: She is alert.  Skin: Skin is warm and dry.  Psychiatric: She has a normal mood and affect. Her behavior is normal.  Nursing note and vitals reviewed.   Assessment and Plan:  1. Type 2 diabetes mellitus, uncontrolled, with neuropathy (HCC) Recommended increase metformin to 1000 mg qam and 500 mg qpm and cont glipizide 5 mg bid, recheck a1c next vist, may be able to transition to  metformin alone if diet/weight improve - Urine Microalbumin w/creat. ratio - Ambulatory referral to diabetic education  2. Essential hypertension Poor control, advised increasing lisinopril to 10 mg daily but pt wishes to keep at 5 mg daily and cut back on salt intake  3. Vitamin B12 deficiency Well controlled on supplement  4. Hyperlipidemia Consider statin/asa next visit given calculated 10 yr CVR 54%  5. Vitamin D deficiency On increased supplement, recheck level next visit  Return in about 2 months (around 02/06/2016).  Satira Anis. [facility]  12/07/2015

## 2015-12-08 LAB — MICROALBUMIN / CREATININE URINE RATIO
Creatinine, Urine: 128.1 mg/dL
MICROALB/CREAT RATIO: 37.3 mg/g creat — ABNORMAL HIGH (ref 0.0–30.0)
MICROALBUM., U, RANDOM: 47.8 ug/mL

## 2015-12-10 MED ORDER — LISINOPRIL 10 MG PO TABS: 10.0000 mg | ORAL_TABLET | Freq: Every day | ORAL | Status: DC

## 2015-12-10 NOTE — Addendum Note (Signed)
Addended by: Adline Potter on: 12/10/2015 11:15 AM   Modules accepted: Orders, Medications

## 2015-12-11 ENCOUNTER — Telehealth: Payer: Self-pay

## 2015-12-11 ENCOUNTER — Other Ambulatory Visit: Payer: Self-pay

## 2015-12-11 NOTE — Telephone Encounter (Signed)
Sent to [place] 

## 2015-12-12 NOTE — Telephone Encounter (Signed)
Baxter Flattery handled

## 2016-01-15 ENCOUNTER — Ambulatory Visit: Payer: Self-pay | Admitting: Dietician

## 2016-02-06 ENCOUNTER — Ambulatory Visit (INDEPENDENT_AMBULATORY_CARE_PROVIDER_SITE_OTHER): Payer: Medicare Other | Admitting: Family Medicine

## 2016-02-06 ENCOUNTER — Encounter: Payer: Self-pay | Admitting: Family Medicine

## 2016-02-06 VITALS — BP 130/64 | HR 68 | Ht 63.0 in | Wt 172.0 lb

## 2016-02-06 DIAGNOSIS — E538 Deficiency of other specified B group vitamins: Secondary | ICD-10-CM | POA: Diagnosis not present

## 2016-02-06 DIAGNOSIS — K589 Irritable bowel syndrome without diarrhea: Secondary | ICD-10-CM | POA: Diagnosis not present

## 2016-02-06 DIAGNOSIS — E1140 Type 2 diabetes mellitus with diabetic neuropathy, unspecified: Secondary | ICD-10-CM

## 2016-02-06 DIAGNOSIS — E785 Hyperlipidemia, unspecified: Secondary | ICD-10-CM

## 2016-02-06 DIAGNOSIS — E66811 Obesity, class 1: Secondary | ICD-10-CM

## 2016-02-06 DIAGNOSIS — R809 Proteinuria, unspecified: Secondary | ICD-10-CM | POA: Diagnosis not present

## 2016-02-06 DIAGNOSIS — I10 Essential (primary) hypertension: Secondary | ICD-10-CM

## 2016-02-06 DIAGNOSIS — IMO0002 Reserved for concepts with insufficient information to code with codable children: Secondary | ICD-10-CM

## 2016-02-06 DIAGNOSIS — E663 Overweight: Secondary | ICD-10-CM | POA: Insufficient documentation

## 2016-02-06 DIAGNOSIS — E559 Vitamin D deficiency, unspecified: Secondary | ICD-10-CM

## 2016-02-06 DIAGNOSIS — K449 Diaphragmatic hernia without obstruction or gangrene: Secondary | ICD-10-CM

## 2016-02-06 DIAGNOSIS — E1165 Type 2 diabetes mellitus with hyperglycemia: Secondary | ICD-10-CM | POA: Diagnosis not present

## 2016-02-06 DIAGNOSIS — E669 Obesity, unspecified: Secondary | ICD-10-CM

## 2016-02-06 MED ORDER — METHYLCELLULOSE (LAXATIVE) PO POWD: 1.0000 | Freq: Two times a day (BID) | ORAL | Status: DC

## 2016-02-06 MED ORDER — VITAMIN C 1000 MG PO TABS: 1000.0000 mg | ORAL_TABLET | Freq: Every day | ORAL | Status: DC

## 2016-02-06 NOTE — Progress Notes (Signed)
Date:  02/06/2016   Name:  Betty Ibarra   DOB:  [birth year]   MRN:  [id number]  PCP:  Adline Potter, MD    Chief Complaint: Follow-up   History of Present Illness:  This is a 75 y.o. female seen for two month f/u. DM control about the same. Weight up 5#, not exercising regularly. Has increased metformin to 1000 qam and 500 qpm but often forgets afternoon dose. Declines higher doses as makes IBS worse. Not taking any fiver supplements. Also increased lisinopril to 10 mg daily due to elevated MCR. HLD discussed, declines statin use. On vit D 4000 units daily and B12 supplement as well as vit C 1000 mg daily. Known hiatal hernia that bothers her at times.   Review of Systems:  Review of Systems  Constitutional: Negative for fever and fatigue.  Respiratory: Negative for cough and shortness of breath.   Cardiovascular: Negative for chest pain and leg swelling.  Endocrine: Negative for polyuria.  Genitourinary: Negative for difficulty urinating.  Neurological: Negative for syncope and light-headedness.    Patient Active Problem List   Diagnosis Date Noted  . Obesity, Class I, BMI 30-34.9 02/06/2016  . Hiatal hernia 02/06/2016  . Vitamin D deficiency 07/12/2015  . Vitamin B12 deficiency 07/12/2015  . IBS (irritable bowel syndrome) 07/11/2015  . Type 2 diabetes mellitus, uncontrolled, with neuropathy ([place]) 07/06/2015  . Hyperlipidemia 07/06/2015  . Hypertension 07/06/2015  . Colon polyp 07/06/2015  . Arthralgia of hip 07/25/2014  . 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 Authorizing Provider  Cholecalciferol (VITAMIN D) 2000 units CAPS Take 2 capsules (4,000 Units total) by mouth daily. 11/05/15  Yes Adline Potter, MD  glipiZIDE (GLUCOTROL) 5 MG tablet Take 1 tablet (5 mg total) by mouth 2 (two) times daily before a meal. 10/24/15  Yes Adline Potter, MD  glucose blood test strip Use as  instructed 11/07/15  Yes Adline Potter, MD  Lancets [facility] ULTRASOFT) lancets Use as instructed 11/07/15  Yes Adline Potter, MD  lisinopril (PRINIVIL,ZESTRIL) 10 MG tablet Take 1 tablet (10 mg total) by mouth daily. 12/10/15  Yes Adline Potter, MD  metFORMIN (GLUCOPHAGE) 1000 MG tablet Take one tablet each morning and 1/2 tablet each evening 12/07/15  Yes Adline Potter, MD  vitamin B-12 (CYANOCOBALAMIN) 1000 MCG tablet Take 1 tablet (1,000 mcg total) by mouth daily. 07/12/15  Yes Adline Potter, MD  Ascorbic Acid (VITAMIN C) 1000 MG tablet Take 1 tablet (1,000 mg total) by mouth daily. 02/06/16   Adline Potter, MD  methylcellulose (CITRUCEL) oral powder Take 1 packet by mouth 2 (two) times daily. 02/06/16   Adline Potter, MD    Allergies  Allergen Reactions  . Sulfa Antibiotics Itching and Rash  . Penicillins Rash    Past Surgical History  Procedure Laterality Date  . Partial hysterectomy      one ovary remains  . Cholecystectomy    . Colonoscopy  2010, 2015    one polyp    Social History  Substance Use Topics  . Smoking status: Never Smoker   . Smokeless tobacco: None  . Alcohol Use: 1.2 oz/week    2 Standard drinks or equivalent per week    Family History  Problem Relation Age of Onset  . Diabetes Father   . Colon cancer Mother     Medication list has been reviewed and updated.  Physical Examination: BP 130/64 mmHg  Pulse  68  Ht 5\' 3"  (1.6 m)  Wt 172 lb (78.019 kg)  BMI 30.48 kg/m2  Physical Exam  Constitutional: She appears well-developed and well-nourished.  Cardiovascular: Normal rate, regular rhythm and normal heart sounds.   Pulmonary/Chest: Effort normal and breath sounds normal.  Musculoskeletal: She exhibits no edema.  Neurological: She is alert.  Skin: Skin is warm and dry.  Psychiatric: She has a normal mood and affect. Her behavior is normal.  Nursing note and vitals reviewed.   Assessment and Plan:  1. Type 2 diabetes mellitus, uncontrolled, with  neuropathy (HCC) Marginal control on metformin/glipizide - HgB A1c - Urine Microalbumin w/creat. ratio  2. Essential hypertension Improved on increased lisinopril dose  3. IBS (irritable bowel syndrome) Trial Citrucel bid  4. Microalbuminuria MCR today on increased lisinopril dose  5. Hyperlipidemia Advised statin (10 yr CVR 41%) but pt declines, no indication for asa  6. Hiatal hernia Preventive strategies discussed  7. Vitamin D deficiency On increased supplement - Vitamin D (25 hydroxy)  8. Vitamin B12 deficiency Well controlled on supplement  9. Obesity, Class I, BMI 30-34.9 Exercise/weight loss discussed  Return in about 6 months (around 08/08/2016).  Satira Anis. [facility]  02/06/2016

## 2016-02-07 LAB — MICROALBUMIN / CREATININE URINE RATIO
Creatinine, Urine: 47.1 mg/dL
MICROALB/CREAT RATIO: 17.6 mg/g{creat} (ref 0.0–30.0)
Microalbumin, Urine: 8.3 ug/mL

## 2016-02-07 LAB — HEMOGLOBIN A1C
ESTIMATED AVERAGE GLUCOSE: 235 mg/dL
HEMOGLOBIN A1C: 9.8 % — AB (ref 4.8–5.6)

## 2016-02-07 LAB — VITAMIN D 25 HYDROXY (VIT D DEFICIENCY, FRACTURES): VIT D 25 HYDROXY: 25.9 ng/mL — AB (ref 30.0–100.0)

## 2016-02-21 ENCOUNTER — Telehealth: Payer: Self-pay

## 2016-02-21 MED ORDER — GLIPIZIDE 5 MG PO TABS: 5.0000 mg | ORAL_TABLET | Freq: Two times a day (BID) | ORAL | Status: DC

## 2016-02-21 NOTE — Telephone Encounter (Signed)
Rx sent 

## 2016-02-21 NOTE — Addendum Note (Signed)
Addended by: Adline Potter on: 02/21/2016 01:40 PM   Modules accepted: Orders

## 2016-02-21 NOTE — Telephone Encounter (Signed)
Sent to [place] 

## 2016-03-04 ENCOUNTER — Other Ambulatory Visit: Payer: Self-pay | Admitting: Family Medicine

## 2016-03-04 MED ORDER — METFORMIN HCL 1000 MG PO TABS: 1000.0000 mg | ORAL_TABLET | Freq: Two times a day (BID) | ORAL | Status: DC

## 2016-04-02 ENCOUNTER — Ambulatory Visit (INDEPENDENT_AMBULATORY_CARE_PROVIDER_SITE_OTHER): Payer: Medicare Other | Admitting: Internal Medicine

## 2016-04-02 ENCOUNTER — Encounter: Payer: Self-pay | Admitting: Internal Medicine

## 2016-04-02 VITALS — BP 132/78 | HR 78 | Resp 16 | Ht 63.0 in | Wt 168.6 lb

## 2016-04-02 DIAGNOSIS — R109 Unspecified abdominal pain: Secondary | ICD-10-CM

## 2016-04-02 DIAGNOSIS — I10 Essential (primary) hypertension: Secondary | ICD-10-CM

## 2016-04-02 DIAGNOSIS — R252 Cramp and spasm: Secondary | ICD-10-CM | POA: Diagnosis not present

## 2016-04-02 DIAGNOSIS — M25559 Pain in unspecified hip: Secondary | ICD-10-CM | POA: Diagnosis not present

## 2016-04-02 DIAGNOSIS — N760 Acute vaginitis: Secondary | ICD-10-CM | POA: Diagnosis not present

## 2016-04-02 MED ORDER — LISINOPRIL 10 MG PO TABS: 10.0000 mg | ORAL_TABLET | Freq: Every day | ORAL | Status: DC

## 2016-04-02 NOTE — Patient Instructions (Signed)
Calcium 1200 mg per day.

## 2016-04-02 NOTE — Progress Notes (Signed)
Date:  04/02/2016   Name:  Betty Ibarra   DOB:  1941-07-21   MRN:  [id number]   Chief Complaint: Leg Pain; Hip Pain; Flank Pain; and Fishy odor Diabetes She presents for her follow-up diabetic visit. She has type 2 diabetes mellitus. Pertinent negatives for diabetes include no chest pain and no fatigue. Symptoms are worsening. Current diabetic treatment includes oral agent (dual therapy) (metformin increased to 1000 mg bid last visit).  Hypertension This is a chronic problem. The problem is unchanged. The problem is controlled. Pertinent negatives include no chest pain, palpitations or shortness of breath. Risk factors for coronary artery disease include diabetes mellitus. Past treatments include ACE inhibitors. The current treatment provides significant improvement.  Hip Pain  There was no injury mechanism. The pain is present in the right hip and left hip. The quality of the pain is described as aching. The pain has been fluctuating since onset. Associated symptoms comments: Has been seen by Orthopedics and told she has OA.  She may have had an injection at one time..   Lab Results  Component Value Date   HGBA1C 9.8* 02/06/2016   Lab Results  Component Value Date   CREATININE 0.92 07/11/2015   Vaginitis - has noted a fishy odor.  Eating more yogurt to try to help.  There is no itching or burning, no discharge.  Flank pain - started after going bowling.  In the left flank and comes around to the front and lower abdomen.  No urinary sx.  It has gotten better with Norco and heat.   She has not tried Advil.  Muscle Cramps - usually at night and mostly in the feet and thighs.  Review of Systems  Constitutional: Negative for fever, chills, fatigue and unexpected weight change.  Respiratory: Negative for cough, chest tightness and shortness of breath.   Cardiovascular: Negative for chest pain, palpitations and leg swelling.  Gastrointestinal: Negative for nausea, diarrhea,  constipation and anal bleeding.  Genitourinary: Negative for hematuria, vaginal discharge, genital sores, vaginal pain and pelvic pain.  Musculoskeletal: Positive for myalgias, back pain and arthralgias.  Hematological: Negative for adenopathy.  Psychiatric/Behavioral: Positive for sleep disturbance. Negative for dysphoric mood.    Patient Active Problem List   Diagnosis Date Noted  . Obesity, Class I, BMI 30-34.9 02/06/2016  . Hiatal hernia 02/06/2016  . Vitamin D deficiency 07/12/2015  . Vitamin B12 deficiency 07/12/2015  . IBS (irritable bowel syndrome) 07/11/2015  . Type 2 diabetes mellitus, uncontrolled, with neuropathy ([place]) 07/06/2015  . Hyperlipidemia 07/06/2015  . Hypertension 07/06/2015  . Colon polyp 07/06/2015  . 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 Presence of intraocular lens 05/24/2015  . Arthralgia of hip 07/25/2014  . 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 Authorizing Provider  Ascorbic Acid (VITAMIN C) 1000 MG tablet Take 1 tablet (1,000 mg total) by mouth daily. 02/06/16  Yes Adline Potter, MD  Cholecalciferol (VITAMIN D) 2000 units CAPS Take 2 capsules (4,000 Units total) by mouth daily. 11/05/15  Yes Adline Potter, MD  glipiZIDE (GLUCOTROL) 5 MG tablet Take 1 tablet (5 mg total) by mouth 2 (two) times daily before a meal. 02/21/16  Yes Adline Potter, MD  glucose blood test strip Use as instructed 11/07/15  Yes Adline Potter, MD  Lancets [facility] ULTRASOFT) lancets Use as instructed 11/07/15  Yes Adline Potter, MD  lisinopril (PRINIVIL,ZESTRIL) 10 MG tablet Take 1  tablet (10 mg total) by mouth daily. 12/10/15  Yes Adline Potter, MD  metFORMIN (GLUCOPHAGE) 1000 MG tablet Take 1 tablet (1,000 mg total) by mouth 2 (two) times daily with a meal. 03/04/16  Yes Adline Potter, MD  methylcellulose (CITRUCEL) oral powder Take 1 packet by mouth 2 (two) times  daily. 02/06/16  Yes Adline Potter, MD  vitamin B-12 (CYANOCOBALAMIN) 1000 MCG tablet Take 1 tablet (1,000 mcg total) by mouth daily. 07/12/15  Yes Adline Potter, MD    Allergies  Allergen Reactions  . Sulfa Antibiotics Itching and Rash  . Penicillins Rash    Past Surgical History  Procedure Laterality Date  . Partial hysterectomy      one ovary remains  . Cholecystectomy    . Colonoscopy  2010, 2015    one polyp    Social History  Substance Use Topics  . Smoking status: Never Smoker   . Smokeless tobacco: None  . Alcohol Use: 1.2 oz/week    2 Standard drinks or equivalent per week     Medication list has been reviewed and updated.   Physical Exam  Constitutional: She is oriented to person, place, and time. She appears well-developed. No distress.  HENT:  Head: Normocephalic and atraumatic.  Cardiovascular: Normal rate, regular rhythm and normal heart sounds.   Pulmonary/Chest: Effort normal. No respiratory distress.  Abdominal: Soft. Normal appearance and bowel sounds are normal. There is no tenderness.  Musculoskeletal:       Right hip: She exhibits decreased range of motion and tenderness.       Left hip: She exhibits decreased range of motion and tenderness.       Lumbar back: She exhibits decreased range of motion and spasm.  Neurological: She is alert and oriented to person, place, and time.  Skin: Skin is warm and dry. No rash noted.  Psychiatric: She has a normal mood and affect. Her behavior is normal. Thought content normal.  Nursing note and vitals reviewed.   BP 132/78 mmHg  Pulse 78  Resp 16  Ht 5\' 3"  (1.6 m)  Wt 168 lb 9.6 oz (76.476 kg)  BMI 29.87 kg/m2  SpO2 97%  Assessment and Plan: 1. Hip pain, unspecified laterality Handicapped placard application given  2. Flank pain Due to muscle strain - use heat and tylenol  3. Vaginitis Continue yogurt; douche if needed Return if worrisome s/s develop  4. Muscle cramps Recommend regular  exercise Check electrolytes - Comprehensive metabolic panel  5. Essential hypertension controlled - lisinopril (PRINIVIL,ZESTRIL) 10 MG tablet; Take 1 tablet (10 mg total) by mouth daily.  Dispense: 30 tablet; Refill: 5 - Comprehensive metabolic panel   Halina Maidens, MD [facility]  04/02/2016

## 2016-04-03 LAB — COMPREHENSIVE METABOLIC PANEL
ALK PHOS: 78 IU/L (ref 39–117)
ALT: 17 IU/L (ref 0–32)
AST: 21 IU/L (ref 0–40)
Albumin/Globulin Ratio: 1.5 (ref 1.2–2.2)
Albumin: 4 g/dL (ref 3.5–4.8)
BUN/Creatinine Ratio: 17 (ref 12–28)
BUN: 17 mg/dL (ref 8–27)
CHLORIDE: 93 mmol/L — AB (ref 96–106)
CO2: 24 mmol/L (ref 18–29)
Calcium: 9.6 mg/dL (ref 8.7–10.3)
Creatinine, Ser: 1 mg/dL (ref 0.57–1.00)
GFR calc Af Amer: 63 mL/min/{1.73_m2} (ref >59)
GFR calc non Af Amer: 55 mL/min/{1.73_m2} — ABNORMAL LOW (ref >59)
GLUCOSE: 107 mg/dL — AB (ref 65–99)
Globulin, Total: 2.7 g/dL (ref 1.5–4.5)
Potassium: 4.7 mmol/L (ref 3.5–5.2)
Sodium: 138 mmol/L (ref 134–144)
Total Protein: 6.7 g/dL (ref 6.0–8.5)

## 2016-04-15 ENCOUNTER — Other Ambulatory Visit: Payer: Self-pay

## 2016-04-22 ENCOUNTER — Encounter: Payer: Self-pay | Admitting: Internal Medicine

## 2016-04-22 ENCOUNTER — Ambulatory Visit (INDEPENDENT_AMBULATORY_CARE_PROVIDER_SITE_OTHER): Payer: Medicare Other | Admitting: Internal Medicine

## 2016-04-22 VITALS — BP 132/68 | HR 84 | Resp 16 | Ht 63.0 in | Wt 170.0 lb

## 2016-04-22 DIAGNOSIS — R1012 Left upper quadrant pain: Secondary | ICD-10-CM | POA: Diagnosis not present

## 2016-04-22 DIAGNOSIS — I10 Essential (primary) hypertension: Secondary | ICD-10-CM

## 2016-04-22 LAB — POCT URINALYSIS DIPSTICK
BILIRUBIN UA: NEGATIVE
Blood, UA: NEGATIVE
GLUCOSE UA: NEGATIVE
Ketones, UA: NEGATIVE
Leukocytes, UA: NEGATIVE
Nitrite, UA: NEGATIVE
Protein, UA: NEGATIVE
SPEC GRAV UA: 1.01
pH, UA: 6

## 2016-04-22 NOTE — Addendum Note (Signed)
Addended by: Theresia Majors A on: 04/22/2016 05:14 PM   Modules accepted: Orders

## 2016-04-22 NOTE — Progress Notes (Signed)
Date:  04/22/2016   Name:  Betty Ibarra   DOB:  Oct 31, 1940   MRN:  [id number]   Chief Complaint: Abdominal Pain (Pain in left side where she carries cell phone. Concerned about radiation or kidney stones. ) Still complains of fullness on the left mid abdomen started after she went bowling. She denies urinary symptoms or change in bowel habits. She had a colonoscopy year and a half ago that showed a single tubular adenoma. She denies any indigestion nausea or vomiting. She feels that the area is full and somewhat tender but not painful.   Review of Systems  Constitutional: Negative for chills, fatigue and unexpected weight change.  Respiratory: Negative for chest tightness and shortness of breath.   Cardiovascular: Negative for chest pain, palpitations and leg swelling.  Gastrointestinal: Positive for abdominal distention and abdominal pain. Negative for blood in stool, constipation, diarrhea, nausea and vomiting.  Genitourinary: Negative for dysuria, hematuria, pelvic pain and urgency.  Musculoskeletal: Positive for arthralgias. Negative for joint swelling.    Patient Active Problem List   Diagnosis Date Noted  . Obesity, Class I, BMI 30-34.9 02/06/2016  . Hiatal hernia 02/06/2016  . Vitamin D deficiency 07/12/2015  . Vitamin B12 deficiency 07/12/2015  . IBS (irritable bowel syndrome) 07/11/2015  . Type 2 diabetes mellitus, uncontrolled, with neuropathy ([place]) 07/06/2015  . Hyperlipidemia 07/06/2015  . Hypertension 07/06/2015  . Colon polyp 07/06/2015  . 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 Presence of intraocular lens 05/24/2015  . Arthralgia of hip 07/25/2014  . 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 Authorizing Provider  Ascorbic Acid (VITAMIN C) 1000 MG tablet Take 1 tablet (1,000 mg total) by mouth daily. 02/06/16  Yes Adline Potter, MD    Cholecalciferol (VITAMIN D) 2000 units CAPS Take 2 capsules (4,000 Units total) by mouth daily. 11/05/15  Yes Adline Potter, MD  glipiZIDE (GLUCOTROL) 5 MG tablet Take 1 tablet (5 mg total) by mouth 2 (two) times daily before a meal. 02/21/16  Yes Adline Potter, MD  glucose blood test strip Use as instructed 11/07/15  Yes Adline Potter, MD  Lancets [facility] ULTRASOFT) lancets Use as instructed 11/07/15  Yes Adline Potter, MD  lisinopril (PRINIVIL,ZESTRIL) 10 MG tablet Take 1 tablet (10 mg total) by mouth daily. 04/02/16  Yes Glean Hess, MD  metFORMIN (GLUCOPHAGE) 1000 MG tablet Take 1 tablet (1,000 mg total) by mouth 2 (two) times daily with a meal. 03/04/16  Yes Adline Potter, MD  methylcellulose (CITRUCEL) oral powder Take 1 packet by mouth 2 (two) times daily. 02/06/16  Yes Adline Potter, MD  vitamin B-12 (CYANOCOBALAMIN) 1000 MCG tablet Take 1 tablet (1,000 mcg total) by mouth daily. 07/12/15  Yes Adline Potter, MD    Allergies  Allergen Reactions  . Sulfa Antibiotics Itching and Rash  . Penicillins Rash    Past Surgical History:  Procedure Laterality Date  . CHOLECYSTECTOMY    . COLONOSCOPY  2010, 2015   one polyp  . PARTIAL HYSTERECTOMY     one ovary remains    Social History  Substance Use Topics  . Smoking status: Never Smoker  . Smokeless tobacco: Not on file  . Alcohol use 1.2 oz/week    2 Standard drinks or equivalent per week     Medication list has been reviewed and updated.   Physical Exam  Constitutional: She is oriented to person, place, and time.  She appears well-developed. No distress.  HENT:  Head: Normocephalic and atraumatic.  Cardiovascular: Normal rate, regular rhythm and normal heart sounds.   Pulmonary/Chest: Effort normal and breath sounds normal. No respiratory distress.  Abdominal: Soft. Bowel sounds are normal. There is tenderness in the left upper quadrant. There is no rebound, no guarding and no CVA tenderness.    Musculoskeletal: Normal  range of motion.  Neurological: She is alert and oriented to person, place, and time.  Skin: Skin is warm and dry. No rash noted.  Psychiatric: She has a normal mood and affect. Her behavior is normal. Thought content normal.  Nursing note and vitals reviewed.   BP 132/68 (BP Location: Right Arm, Patient Position: Sitting, Cuff Size: Normal)   Pulse 84   Resp 16   Ht 5\' 3"  (1.6 m)   Wt 170 lb (77.1 kg)   SpO2 98%   BMI 30.11 kg/m   Assessment and Plan: 1. Left upper quadrant pain Possible hernia - Ambulatory referral to General Surgery  2. Essential hypertension controlled   Halina Maidens, MD [facility]  04/22/2016

## 2016-04-23 ENCOUNTER — Telehealth: Payer: Self-pay | Admitting: Surgery

## 2016-04-23 NOTE — Telephone Encounter (Signed)
I have called patient to make an appointment per referral for General Surgery for patient to be seen for a possible hernia. No answer. I have left a detailed message for patient to call back to make an appointment.

## 2016-04-30 ENCOUNTER — Ambulatory Visit (INDEPENDENT_AMBULATORY_CARE_PROVIDER_SITE_OTHER): Payer: Medicare Other | Admitting: Surgery

## 2016-04-30 ENCOUNTER — Encounter: Payer: Self-pay | Admitting: Surgery

## 2016-04-30 DIAGNOSIS — R1900 Intra-abdominal and pelvic swelling, mass and lump, unspecified site: Secondary | ICD-10-CM | POA: Diagnosis not present

## 2016-04-30 NOTE — Progress Notes (Signed)
Subjective:     Patient ID: Betty Ibarra, female   DOB: 1941/07/25, 75 y.o.   MRN: [id number]  75 year old female who states that after she was bowling back in June she began to have left flank pain starting in the mid back. Patient states that it then began to radiate around to the front and then go into her left groin. Patient states that at this time the pain is chem be either in her back or the left flank or even left lower quadrant pain or down into her groin. Patient also endorses that she has had a bulge in this area as well. Patient is unsure exactly how long this bulge is been there. Patient states that he has had no fevers no chills she has had some off and on constipation versus diarrhea because she has IBS. Patient states that she is more in a constipated state due to her IBS at this time. The patient has had a previous hysterectomy in 53 and open gallbladder in 79 but no operations in the left lower quadrant. The patient has had colonoscopies in the past last one being in November 2015 which did have a couple of polyps which are negative for any dysplasia. I could not find the actual operative report from that no but from one from 2005 but did note that she had diverticula at that time.   Past Medical History:  Diagnosis Date  . Diabetes mellitus without complication ([place])   . Hyperlipidemia   . Hypertension   . S/P partial hysterectomy    Past Surgical History:  Procedure Laterality Date  . CHOLECYSTECTOMY    . COLONOSCOPY  2010, 2015   one polyp  . PARTIAL HYSTERECTOMY     one ovary remains   Family History  Problem Relation Age of Onset  . Colon cancer Mother   . Diabetes Father    Social History   Social History  . Marital status: Widowed    Spouse name: N/A  . Number of children: N/A  . Years of education: N/A   Social History Main Topics  . Smoking status: Never Smoker  . Smokeless tobacco: Never Used  . Alcohol use No  . Drug use: No  . Sexual activity:  Not Currently   Other Topics Concern  . None   Social History Narrative  . None    Current Outpatient Prescriptions:  .  Cholecalciferol (VITAMIN D) 2000 units CAPS, Take 2 capsules (4,000 Units total) by mouth daily., Disp: 30 capsule, Rfl:  .  glipiZIDE (GLUCOTROL) 5 MG tablet, Take 1 tablet (5 mg total) by mouth 2 (two) times daily before a meal., Disp: 180 tablet, Rfl: 3 .  glucose blood test strip, Use as instructed, Disp: 100 each, Rfl: 12 .  Lancets (ONETOUCH ULTRASOFT) lancets, Use as instructed, Disp: 100 each, Rfl: 12 .  lisinopril (PRINIVIL,ZESTRIL) 10 MG tablet, Take 1 tablet (10 mg total) by mouth daily., Disp: 30 tablet, Rfl: 5 .  metFORMIN (GLUCOPHAGE) 1000 MG tablet, Take 1 tablet (1,000 mg total) by mouth 2 (two) times daily with a meal., Disp: 135 tablet, Rfl: 3 .  vitamin B-12 (CYANOCOBALAMIN) 1000 MCG tablet, Take 1 tablet (1,000 mcg total) by mouth daily., Disp: 30 tablet, Rfl: 0 Allergies  Allergen Reactions  . Sulfa Antibiotics Itching and Rash  . Penicillins Rash    Review of Systems  Constitutional: Positive for activity change and fatigue. Negative for appetite change, chills, fever and unexpected weight change.  HENT: Negative for  congestion, sore throat and trouble swallowing.   Respiratory: Negative for cough, choking, chest tightness, shortness of breath and wheezing.   Cardiovascular: Negative for chest pain, palpitations and leg swelling.  Gastrointestinal: Positive for abdominal distention, abdominal pain, constipation and diarrhea. Negative for anal bleeding, blood in stool, nausea, rectal pain and vomiting.  Genitourinary: Positive for flank pain. Negative for dysuria and hematuria.  Musculoskeletal: Positive for back pain. Negative for joint swelling and neck pain.  Skin: Negative for color change, pallor, rash and wound.  Neurological: Negative for dizziness and tremors.  Hematological: Negative for adenopathy. Does not bruise/bleed easily.   Psychiatric/Behavioral: Negative for agitation. The patient is not nervous/anxious.   All other systems reviewed and are negative.      Vitals:   04/30/16 1055  BP: (!) 163/70  Pulse: 75  Temp: 99.1 F (37.3 C)    Objective:   Physical Exam  Constitutional: She is oriented to person, place, and time. She appears well-developed and well-nourished. No distress.  HENT:  Head: Normocephalic and atraumatic.  Right Ear: External ear normal.  Left Ear: External ear normal.  Nose: Nose normal.  Mouth/Throat: Oropharynx is clear and moist. No oropharyngeal exudate.  Eyes: Conjunctivae and EOM are normal. Pupils are equal, round, and reactive to light. No scleral icterus.  Neck: Normal range of motion. Neck supple. No tracheal deviation present.  Cardiovascular: Normal rate, regular rhythm and intact distal pulses.  Exam reveals no gallop and no friction rub.   No murmur heard. Pulmonary/Chest: Effort normal and breath sounds normal. No respiratory distress. She has no wheezes. She has no rales.  Abdominal: Soft. Bowel sounds are normal. She exhibits distension and mass. There is tenderness. There is no rebound.  Well-healed surgical scar in the midline lower abdomen as well as in the right upper quadrant with some thickened scar tissue underneath the right costal diaphragm. Left lower abdomen lateral quadrant with a bowl just noticeable from the right side however was significant scar unsure if this indicates a specific mass in the area, the area is soft and able to be compressed however no frank hernia defect can be palpated  Musculoskeletal: Normal range of motion. She exhibits no edema, tenderness or deformity.  Neurological: She is oriented to person, place, and time. No cranial nerve deficit.  Skin: Skin is warm and dry. No rash noted. No erythema. No pallor.  Psychiatric: She has a normal mood and affect. Her behavior is normal. Judgment and thought content normal.  Vitals  reviewed.   CMP Latest Ref Rng & Units 04/02/2016 07/11/2015  Glucose 65 - 99 mg/dL 107(H) 214(H)  BUN 8 - 27 mg/dL 17 18  Creatinine 0.57 - 1.00 mg/dL 1.00 0.92  Sodium 134 - 144 mmol/L 138 136  Potassium 3.5 - 5.2 mmol/L 4.7 4.2  Chloride 96 - 106 mmol/L 93(L) 93(L)  CO2 18 - 29 mmol/L 24 24  Calcium 8.7 - 10.3 mg/dL 9.6 8.9  Total Protein 6.0 - 8.5 g/dL 6.7 6.7  Total Bilirubin 0.0 - 1.2 mg/dL <0.2 <0.2  Alkaline Phos 39 - 117 IU/L 78 94  AST 0 - 40 IU/L 21 15  ALT 0 - 32 IU/L 17 16     Assessment:     75 year old female with the left flank mass    Plan:     I have personally reviewed the patient's past medical history where she does have some well-controlled hypertension and hyperlipidemia as well as fairly well-controlled diabetes mellitus. I also personally  reviewed her laboratory values which she had a UA that was negative and CMP that was essentially within normal limits just recently.   The patient does not have any CT scan imaging of the abdomen that I could find and while this area could represent a Petit's hernia, it also could represent a mass in the area. Her symptoms as well could went her to potentially having diverticulitis as the causative factor. I will order a CT scan to evaluate the area and see if this area is a hernia. I will have the patient return after the CT scan is completed to discuss the operation is needed further depending on the results of the scan.

## 2016-04-30 NOTE — Patient Instructions (Signed)
We will call you later today with an appointment to have a CT scan done. We will make you a follow up appointment after your scan to discuss the results. Please call our office if you have any questions or concerns.

## 2016-05-01 ENCOUNTER — Telehealth: Payer: Self-pay | Admitting: Surgery

## 2016-05-01 ENCOUNTER — Ambulatory Visit: Payer: Medicare Other | Admitting: Surgery

## 2016-05-01 NOTE — Telephone Encounter (Signed)
Patient called and said she was waiting on you to let her know about an appointment date for a scan.

## 2016-05-01 NOTE — Telephone Encounter (Signed)
Returned call to patient at this time. I let her know that prior authorization has not been obtained as of yet to schedule the CT scan. I let her know that once it has been obtained I would make the appointment and notify her.

## 2016-05-02 ENCOUNTER — Telehealth: Payer: Self-pay

## 2016-05-02 NOTE — Telephone Encounter (Signed)
Noted. Patient's referral has been canceled. Patient has refused CT scan at our facility.

## 2016-05-02 NOTE — Telephone Encounter (Signed)
Patient wants to make an appointment with [facility] and get her CT scan through them. She wants to go back to doctor Burgland. She appreciates Dr. Azalee Course, but she is [place] [employer] and feels like she should get her CT scan done through a surgeon who is conservative with blood just incase she needs to go through surgery.

## 2016-05-27 DIAGNOSIS — M4316 Spondylolisthesis, lumbar region: Secondary | ICD-10-CM | POA: Diagnosis not present

## 2016-06-09 DIAGNOSIS — R109 Unspecified abdominal pain: Secondary | ICD-10-CM | POA: Diagnosis not present

## 2016-06-24 ENCOUNTER — Ambulatory Visit: Payer: Medicare Other | Admitting: Internal Medicine

## 2016-06-25 DIAGNOSIS — K760 Fatty (change of) liver, not elsewhere classified: Secondary | ICD-10-CM | POA: Diagnosis not present

## 2016-06-25 DIAGNOSIS — R109 Unspecified abdominal pain: Secondary | ICD-10-CM | POA: Diagnosis not present

## 2016-06-27 ENCOUNTER — Telehealth: Payer: Self-pay

## 2016-06-27 ENCOUNTER — Other Ambulatory Visit: Payer: Self-pay | Admitting: Internal Medicine

## 2016-06-27 ENCOUNTER — Other Ambulatory Visit: Payer: Medicare Other

## 2016-06-27 DIAGNOSIS — I10 Essential (primary) hypertension: Secondary | ICD-10-CM

## 2016-06-27 NOTE — Progress Notes (Signed)
Came in for labs only

## 2016-06-27 NOTE — Telephone Encounter (Signed)
Needs lab order for  Creatinine to be checked as [facility] told her to contact PCP. Ordered and she can pick up today just needs released

## 2016-06-28 LAB — CREATININE, SERUM
CREATININE: 0.92 mg/dL (ref 0.57–1.00)
GFR calc Af Amer: 69 mL/min/{1.73_m2} (ref >59)
GFR calc non Af Amer: 60 mL/min/{1.73_m2} (ref >59)

## 2016-07-01 ENCOUNTER — Telehealth: Payer: Self-pay

## 2016-07-01 NOTE — Telephone Encounter (Signed)
Advised normal labs ok to start back on Metformin.

## 2016-07-04 DIAGNOSIS — M4316 Spondylolisthesis, lumbar region: Secondary | ICD-10-CM | POA: Diagnosis not present

## 2016-07-11 DIAGNOSIS — M4316 Spondylolisthesis, lumbar region: Secondary | ICD-10-CM | POA: Diagnosis not present

## 2016-07-18 DIAGNOSIS — M4316 Spondylolisthesis, lumbar region: Secondary | ICD-10-CM | POA: Diagnosis not present

## 2016-08-15 ENCOUNTER — Ambulatory Visit: Payer: Medicare Other | Admitting: Family Medicine

## 2016-08-22 ENCOUNTER — Ambulatory Visit: Payer: Medicare Other | Admitting: Family Medicine

## 2016-10-08 ENCOUNTER — Ambulatory Visit (INDEPENDENT_AMBULATORY_CARE_PROVIDER_SITE_OTHER): Payer: Medicare Other | Admitting: Family Medicine

## 2016-10-08 ENCOUNTER — Encounter: Payer: Self-pay | Admitting: Family Medicine

## 2016-10-08 VITALS — BP 138/76 | HR 96 | Resp 16 | Ht 63.0 in | Wt 171.0 lb

## 2016-10-08 DIAGNOSIS — E559 Vitamin D deficiency, unspecified: Secondary | ICD-10-CM | POA: Diagnosis not present

## 2016-10-08 DIAGNOSIS — E1165 Type 2 diabetes mellitus with hyperglycemia: Secondary | ICD-10-CM

## 2016-10-08 DIAGNOSIS — E669 Obesity, unspecified: Secondary | ICD-10-CM | POA: Diagnosis not present

## 2016-10-08 DIAGNOSIS — K589 Irritable bowel syndrome without diarrhea: Secondary | ICD-10-CM | POA: Diagnosis not present

## 2016-10-08 DIAGNOSIS — E538 Deficiency of other specified B group vitamins: Secondary | ICD-10-CM

## 2016-10-08 DIAGNOSIS — R2681 Unsteadiness on feet: Secondary | ICD-10-CM | POA: Diagnosis not present

## 2016-10-08 DIAGNOSIS — I10 Essential (primary) hypertension: Secondary | ICD-10-CM

## 2016-10-08 DIAGNOSIS — E66811 Obesity, class 1: Secondary | ICD-10-CM

## 2016-10-08 DIAGNOSIS — H353190 Nonexudative age-related macular degeneration, unspecified eye, stage unspecified: Secondary | ICD-10-CM | POA: Diagnosis not present

## 2016-10-08 DIAGNOSIS — E1140 Type 2 diabetes mellitus with diabetic neuropathy, unspecified: Secondary | ICD-10-CM

## 2016-10-08 DIAGNOSIS — IMO0002 Reserved for concepts with insufficient information to code with codable children: Secondary | ICD-10-CM

## 2016-10-08 MED ORDER — VITAMIN D3 125 MCG (5000 UT) PO CAPS: 1.0000 | ORAL_CAPSULE | Freq: Every day | ORAL | Status: DC

## 2016-10-08 NOTE — Progress Notes (Signed)
Date:  10/08/2016   Name:  Betty Ibarra   DOB:  [birth year]   MRN:  [id number]  PCP:  Adline Potter, MD    Chief Complaint: Diabetes (follow up - Taking ALL diabetes meds different- Only once a day. ) and Hypertension   History of Present Illness:  This is a 75 y.o. female seen for 8 month f/u. Has only been taking taking glipizide and metformin daily and sugars running in 200's. Weight also up, interested in seeing diabetic educator to discuss diet and weight loss. Injured L leg in August, saw ortho, MRI showed "bone on bone" in spine, LLE pain resolved but still feels weak and walking less stable, now using cane. IBS well controlled recently. Taking zinc, mvit, mg, tumeric, and fish oil daily but unsure of doses. CT in October showed small L ant abd hernia but surgeons said not operable. C/o decreased vision she relates to DM. Declines flu/pneumo imms.  Review of Systems:  Review of Systems  Constitutional: Negative for chills and fever.  Respiratory: Negative for cough and shortness of breath.   Cardiovascular: Negative for chest pain and leg swelling.  Gastrointestinal: Negative for abdominal pain.  Endocrine: Negative for polydipsia and polyuria.  Neurological: Negative for tremors, syncope and light-headedness.  Hematological: Negative for adenopathy.    Patient Active Problem List   Diagnosis Date Noted  . Left flank mass 04/30/2016  . Obesity, Class I, BMI 30-34.9 02/06/2016  . Hiatal hernia 02/06/2016  . Vitamin D deficiency 07/12/2015  . Vitamin B12 deficiency 07/12/2015  . IBS (irritable bowel syndrome) 07/11/2015  . Type 2 diabetes mellitus, uncontrolled, with neuropathy ([place]) 07/06/2015  . Hyperlipidemia 07/06/2015  . Hypertension 07/06/2015  . Colon polyp 07/06/2015  . 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 Presence of intraocular lens 05/24/2015  . Pseudophakia of right eye 05/24/2015  . Arthralgia of hip 07/25/2014  . Combined forms of  age-related cataract of both eyes 07/12/2014    Prior to Admission medications   Medication Sig Start Date End Date Taking? Authorizing Provider  Cholecalciferol (VITAMIN D) 2000 units CAPS Take 2 capsules (4,000 Units total) by mouth daily. 11/05/15  Yes Adline Potter, MD  glipiZIDE (GLUCOTROL) 5 MG tablet Take 1 tablet (5 mg total) by mouth 2 (two) times daily before a meal. 02/21/16  Yes Adline Potter, MD  glucose blood test strip Use as instructed 11/07/15  Yes Adline Potter, MD  Lancets [facility] ULTRASOFT) lancets Use as instructed 11/07/15  Yes Adline Potter, MD  lisinopril (PRINIVIL,ZESTRIL) 10 MG tablet Take 1 tablet (10 mg total) by mouth daily. 04/02/16  Yes Glean Hess, MD  metFORMIN (GLUCOPHAGE) 1000 MG tablet Take 1 tablet (1,000 mg total) by mouth 2 (two) times daily with a meal. 03/04/16  Yes Adline Potter, MD  vitamin B-12 (CYANOCOBALAMIN) 1000 MCG tablet Take 1 tablet (1,000 mcg total) by mouth daily. 07/12/15  Yes Adline Potter, MD    Allergies  Allergen Reactions  . Sulfa Antibiotics Itching and Rash  . Penicillins Rash    Past Surgical History:  Procedure Laterality Date  . CHOLECYSTECTOMY    . COLONOSCOPY  2010, 2015   one polyp  . PARTIAL HYSTERECTOMY     one ovary remains    Social History  Substance Use Topics  . Smoking status: Never Smoker  . Smokeless tobacco: Never Used  . Alcohol use No    Family History  Problem Relation Age of Onset  . Colon cancer Mother   . Diabetes  Father     Medication list has been reviewed and updated.  Physical Examination: BP 138/76   Pulse 96   Resp 16   Ht 5\' 3"  (1.6 m)   Wt 171 lb (77.6 kg)   SpO2 98%   BMI 30.29 kg/m   Physical Exam  Constitutional: She appears well-developed and well-nourished.  Cardiovascular: Normal rate, regular rhythm, normal heart sounds and intact distal pulses.   Pulmonary/Chest: Effort normal and breath sounds normal.  Musculoskeletal: She exhibits no edema.  Neurological:  She is alert.  Skin: Skin is warm and dry.  Feet no sores  Psychiatric: She has a normal mood and affect. Her behavior is normal.  Nursing note and vitals reviewed.   Assessment and Plan:  1. Type 2 diabetes mellitus, uncontrolled, with neuropathy (HCC) Poor control likely (a1c 9.8% in May) taking decreased meds, pt hesitant to start insulin, consider asa/statin (declined in past) - Urine Microalbumin w/creat. ratio - HgB A1c - TSH - Ambulatory referral to diabetic education  2. Essential hypertension Marginal control, consider increased lisinopril - Comprehensive Metabolic Panel (CMET) - CBC  3. Nonexudative age-related macular degeneration Reporting visual changes - Ambulatory referral to Ophthalmology  4. Irritable bowel syndrome, unspecified type Well controlled  5. Obesity, Class I, BMI 30-34.9 Stable, hopes diabetic educator can help her lose weight  6. Gait instability Worse since last visit, likely related to neuropathy, consider PT referral  7. Vitamin D deficiency On supplement - Vitamin D (25 hydroxy)  8. Vitamin B12 deficiency On supplement - B12  Return in about 3 months (around 01/06/2017).  Satira Anis. [facility]  10/08/2016

## 2016-10-09 ENCOUNTER — Other Ambulatory Visit: Payer: Self-pay | Admitting: Family Medicine

## 2016-10-09 DIAGNOSIS — I10 Essential (primary) hypertension: Secondary | ICD-10-CM

## 2016-10-09 LAB — COMPREHENSIVE METABOLIC PANEL
ALBUMIN: 3.9 g/dL (ref 3.5–4.8)
ALT: 14 IU/L (ref 0–32)
AST: 10 IU/L (ref 0–40)
Albumin/Globulin Ratio: 1.3 (ref 1.2–2.2)
Alkaline Phosphatase: 104 IU/L (ref 39–117)
BUN / CREAT RATIO: 27 (ref 12–28)
BUN: 25 mg/dL (ref 8–27)
Bilirubin Total: <0.2 mg/dL (ref 0.0–1.2)
CALCIUM: 9.3 mg/dL (ref 8.7–10.3)
CO2: 24 mmol/L (ref 18–29)
CREATININE: 0.91 mg/dL (ref 0.57–1.00)
Chloride: 88 mmol/L — ABNORMAL LOW (ref 96–106)
GFR calc Af Amer: 70 mL/min/{1.73_m2} (ref >59)
GFR, EST NON AFRICAN AMERICAN: 61 mL/min/{1.73_m2} (ref >59)
GLOBULIN, TOTAL: 2.9 g/dL (ref 1.5–4.5)
Glucose: 387 mg/dL — ABNORMAL HIGH (ref 65–99)
Potassium: 5 mmol/L (ref 3.5–5.2)
SODIUM: 130 mmol/L — AB (ref 134–144)
Total Protein: 6.8 g/dL (ref 6.0–8.5)

## 2016-10-09 LAB — HEMOGLOBIN A1C
Est. average glucose Bld gHb Est-mCnc: 309 mg/dL
Hgb A1c MFr Bld: 12.4 % — ABNORMAL HIGH (ref 4.8–5.6)

## 2016-10-09 LAB — TSH: TSH: 1.9 u[IU]/mL (ref 0.450–4.500)

## 2016-10-09 LAB — MICROALBUMIN / CREATININE URINE RATIO
Creatinine, Urine: 57.8 mg/dL
Microalb/Creat Ratio: 31.5 mg/g creat — ABNORMAL HIGH (ref 0.0–30.0)
Microalbumin, Urine: 18.2 ug/mL

## 2016-10-09 LAB — CBC
Hematocrit: 40.3 % (ref 34.0–46.6)
Hemoglobin: 13.3 g/dL (ref 11.1–15.9)
MCH: 29.3 pg (ref 26.6–33.0)
MCHC: 33 g/dL (ref 31.5–35.7)
MCV: 89 fL (ref 79–97)
PLATELETS: 343 10*3/uL (ref 150–379)
RBC: 4.54 x10E6/uL (ref 3.77–5.28)
RDW: 14.1 % (ref 12.3–15.4)
WBC: 7.8 10*3/uL (ref 3.4–10.8)

## 2016-10-09 LAB — VITAMIN B12: VITAMIN B 12: 1717 pg/mL — AB (ref 232–1245)

## 2016-10-09 LAB — VITAMIN D 25 HYDROXY (VIT D DEFICIENCY, FRACTURES): VIT D 25 HYDROXY: 33.6 ng/mL (ref 30.0–100.0)

## 2016-10-09 MED ORDER — VITAMIN B-12 1000 MCG PO TABS: 500.0000 ug | ORAL_TABLET | Freq: Every day | ORAL | 0 refills | Status: AC

## 2016-10-09 MED ORDER — LISINOPRIL 20 MG PO TABS: 20.0000 mg | ORAL_TABLET | Freq: Every day | ORAL | 2 refills | Status: DC

## 2016-11-04 DIAGNOSIS — E119 Type 2 diabetes mellitus without complications: Secondary | ICD-10-CM | POA: Diagnosis not present

## 2016-11-06 ENCOUNTER — Encounter: Payer: Medicare Other | Attending: Family Medicine | Admitting: *Deleted

## 2016-11-06 ENCOUNTER — Encounter: Payer: Self-pay | Admitting: *Deleted

## 2016-11-06 VITALS — BP 134/60 | Ht 63.0 in | Wt 172.5 lb

## 2016-11-06 DIAGNOSIS — Z713 Dietary counseling and surveillance: Secondary | ICD-10-CM | POA: Insufficient documentation

## 2016-11-06 DIAGNOSIS — E1165 Type 2 diabetes mellitus with hyperglycemia: Secondary | ICD-10-CM | POA: Insufficient documentation

## 2016-11-06 DIAGNOSIS — E1140 Type 2 diabetes mellitus with diabetic neuropathy, unspecified: Secondary | ICD-10-CM | POA: Insufficient documentation

## 2016-11-06 NOTE — Patient Instructions (Addendum)
Check blood sugars 1 x day before breakfast or 2 hrs after supper every day  Exercise: Continue   recumbent bike   for   20 minutes   2-4 days a week  Eat 3 meals day, 1-2  snacks a day Space meals 4-6 hours apart Avoid sugar sweetened drinks (tea)  Complete 3 Day Food Record and bring to next appt  Bring blood sugar records to the next appointment  Return for appointment on: Thursday December 04, 2016 at 10:30 am with Jaclyn Shaggy (dietitian)

## 2016-11-06 NOTE — Progress Notes (Signed)
Diabetes Self-Management Education  Visit Type: First/Initial  Appt. Start Time: 1335 Appt. End Time: [id number]  11/06/2016  [name], identified by name and date of birth, is a 76 y.o. female with a diagnosis of Diabetes: Type 2.   ASSESSMENT  Blood pressure 134/60, height 5\' 3"  (1.6 m), weight 172 lb 8 oz (78.2 kg). Body mass index is 30.56 kg/m.      Diabetes Self-Management Education - 11/06/16 1706      Visit Information   Visit Type First/Initial     Initial Visit   Diabetes Type Type 2   Are you currently following a meal plan? Yes   What type of meal plan do you follow? eating less, cutting out most sugars   Are you taking your medications as prescribed? Yes   Date Diagnosed over 20 years     Health Coping   How would you rate your overall health? Fair     Psychosocial Assessment   Patient Belief/Attitude about Diabetes Motivated to manage diabetes   Self-care barriers None   Self-management support Doctor's office;Friends   Other persons present Friend   Patient Concerns Nutrition/Meal planning;Medication;Monitoring;Weight Control;Glycemic Control   Special Needs None   Preferred Learning Style Visual;Auditory;Hands on   Learning Readiness Change in progress   How often do you need to have someone help you when you read instructions, pamphlets, or other written materials from your doctor or pharmacy? 1 - Never     Pre-Education Assessment   Patient understands the diabetes disease and treatment process. Needs Review   Patient understands incorporating nutritional management into lifestyle. Needs Review   Patient undertands incorporating physical activity into lifestyle. Needs Instruction   Patient understands using medications safely. Needs Review   Patient understands monitoring blood glucose, interpreting and using results Needs Review   Patient understands prevention, detection, and treatment of acute complications. Needs Instruction   Patient  understands prevention, detection, and treatment of chronic complications. Needs Review   Patient understands how to develop strategies to address psychosocial issues. Needs Instruction   Patient understands how to develop strategies to promote health/change behavior. Needs Instruction     Complications   Last HgB A1C per patient/outside source 12.4 %  10/08/16   How often do you check your blood sugar? 3-4 times / week   Fasting Blood glucose range (mg/dL) 130-179;180-200  Pt reports FBG's 160-190's mg/dL.    Have you had a dilated eye exam in the past 12 months? Yes   Have you had a dental exam in the past 12 months? Yes   Are you checking your feet? No     Dietary Intake   Breakfast cereal or oatmeal or sandwich with beets, carrots and mayo; sandwich with apple and cheese   Snack (morning) nuts   Lunch tomatoes, chicken, stir fry, baked potatoes, egg plant, green beans   Dinner same as lunch   Beverage(s) fruit juice, occasional sugar sweetened tea, coffee, diet soda, water     Exercise   Exercise Type Light (walking / raking leaves)  recumbent bike   How many days per week to you exercise? 2   How many minutes per day do you exercise? 20   Total minutes per week of exercise 40     Patient Education   Previous Diabetes Education Yes (please comment)  years ago in [place]   Disease state  Explored patient's options for treatment of their diabetes   Nutrition management  Role of diet in the  treatment of diabetes and the relationship between the three main macronutrients and blood glucose level;Food label reading, portion sizes and measuring food.;Carbohydrate counting;Reviewed blood glucose goals for pre and post meals and how to evaluate the patients' food intake on their blood glucose level.   Physical activity and exercise  Role of exercise on diabetes management, blood pressure control and cardiac health.   Medications Reviewed patients medication for diabetes, action, purpose,  timing of dose and side effects.   Monitoring Purpose and frequency of SMBG.;Taught/discussed recording of test results and interpretation of SMBG.;Identified appropriate SMBG and/or A1C goals.   Chronic complications Relationship between chronic complications and blood glucose control   Psychosocial adjustment Identified and addressed patients feelings and concerns about diabetes     Individualized Goals (developed by patient)   Reducing Risk Improve blood sugars Decrease medications Prevent diabetes complications Lose weight Become more fit     Outcomes   Expected Outcomes Demonstrated interest in learning. Expect positive outcomes   Future DMSE 4-6 wks      Individualized Plan for Diabetes Self-Management Training:   Learning Objective:  Patient will have a greater understanding of diabetes self-management. Patient education plan is to attend individual and/or group sessions per assessed needs and concerns.   Plan:   Patient Instructions  Check blood sugars 1 x day before breakfast or 2 hrs after supper every day Exercise: Continue   recumbent bike   for   20 minutes   2-4 days a week Eat 3 meals day, 1-2  snacks a day Space meals 4-6 hours apart Avoid sugar sweetened drinks (tea)  Complete 3 Day Food Record and bring to next appt Bring blood sugar records to the next appointment Return for appointment on: Thursday December 04, 2016 at 10:30 am with Jaclyn Shaggy (dietitian)   Expected Outcomes:  Demonstrated interest in learning. Expect positive outcomes  Education material provided:  General Meal Planning Guidelines Simple Meal Plan 3 Day Food Record  If problems or questions, patient to contact team via:  Johny Drilling, RN, CCM, CDE [phone]  Future DSME appointment: 4-6 wks  December 04, 2016 with the dietitian

## 2016-11-12 ENCOUNTER — Ambulatory Visit: Payer: Medicare Other | Admitting: Family Medicine

## 2016-11-12 ENCOUNTER — Ambulatory Visit: Payer: Medicare Other | Admitting: Internal Medicine

## 2016-11-13 ENCOUNTER — Encounter: Payer: Self-pay | Admitting: Internal Medicine

## 2016-11-13 ENCOUNTER — Ambulatory Visit (INDEPENDENT_AMBULATORY_CARE_PROVIDER_SITE_OTHER): Payer: Medicare Other | Admitting: Internal Medicine

## 2016-11-13 VITALS — BP 122/64 | HR 72 | Ht 63.0 in | Wt 168.8 lb

## 2016-11-13 DIAGNOSIS — N3000 Acute cystitis without hematuria: Secondary | ICD-10-CM | POA: Diagnosis not present

## 2016-11-13 LAB — POC URINALYSIS WITH MICROSCOPIC (NON AUTO)MANUAL RESULT
BILIRUBIN UA: NEGATIVE
Crystals: 0
GLUCOSE UA: NEGATIVE
KETONES UA: NEGATIVE
Mucus, UA: 0
NITRITE UA: POSITIVE
Protein, UA: NEGATIVE
RBC: 0 M/uL — AB (ref 4.04–5.48)
SPEC GRAV UA: 1.015
Urobilinogen, UA: 0.2
pH, UA: 7.5

## 2016-11-13 MED ORDER — CIPROFLOXACIN HCL 250 MG PO TABS: 250.0000 mg | ORAL_TABLET | Freq: Two times a day (BID) | ORAL | 0 refills | Status: DC

## 2016-11-13 NOTE — Patient Instructions (Signed)

## 2016-11-13 NOTE — Progress Notes (Signed)
Date:  11/13/2016   Name:  Betty Ibarra   DOB:  08-30-1941   MRN:  [id number]   Chief Complaint: Urinary Tract Infection (Burning/ nausea. Abdominal bloating. ) Urinary Tract Infection   This is a new problem. The current episode started in the past 7 days. The problem occurs every urination. The problem has been gradually worsening. The quality of the pain is described as burning. There has been no fever. Associated symptoms include chills, frequency, nausea, urgency and vomiting. Pertinent negatives include no discharge. She has tried increased fluids for the symptoms. The treatment provided no relief.    Review of Systems  Constitutional: Positive for chills. Negative for fatigue and fever.  Respiratory: Negative for cough and shortness of breath.   Cardiovascular: Negative for chest pain, palpitations and leg swelling.  Gastrointestinal: Positive for nausea and vomiting.  Genitourinary: Positive for dysuria, frequency and urgency. Negative for difficulty urinating.    Patient Active Problem List   Diagnosis Date Noted  . Gait instability 10/08/2016  . Left flank mass 04/30/2016  . Obesity, Class I, BMI 30-34.9 02/06/2016  . Hiatal hernia 02/06/2016  . Vitamin D deficiency 07/12/2015  . Vitamin B12 deficiency 07/12/2015  . IBS (irritable bowel syndrome) 07/11/2015  . Type 2 diabetes mellitus, uncontrolled, with neuropathy ([place]) 07/06/2015  . Hyperlipidemia 07/06/2015  . Hypertension 07/06/2015  . Colon polyp 07/06/2015  . 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 Presence of intraocular lens 05/24/2015  . Pseudophakia of right eye 05/24/2015  . Arthralgia of hip 07/25/2014  . Combined forms of age-related cataract of both eyes 07/12/2014    Prior to Admission medications   Medication Sig Start Date End Date Taking? Authorizing Provider  Cholecalciferol (VITAMIN D3) 2000 units TABS Take 2 tablets by mouth daily.   Yes Historical Provider, MD    glipiZIDE (GLUCOTROL) 5 MG tablet Take 1 tablet (5 mg total) by mouth 2 (two) times daily before a meal. 02/21/16  Yes Adline Potter, MD  glucose blood test strip Use as instructed 11/07/15  Yes Adline Potter, MD  Lancets [facility] ULTRASOFT) lancets Use as instructed 11/07/15  Yes Adline Potter, MD  lisinopril (PRINIVIL,ZESTRIL) 20 MG tablet Take 1 tablet (20 mg total) by mouth daily. 10/09/16  Yes Adline Potter, MD  metFORMIN (GLUCOPHAGE) 1000 MG tablet Take 1 tablet (1,000 mg total) by mouth 2 (two) times daily with a meal. 03/04/16  Yes Adline Potter, MD  vitamin B-12 (CYANOCOBALAMIN) 1000 MCG tablet Take 0.5 tablets (500 mcg total) by mouth daily. Patient not taking: Reported on 11/13/2016 10/09/16   Adline Potter, MD    Allergies  Allergen Reactions  . Sulfa Antibiotics Itching and Rash  . Penicillins Rash    Past Surgical History:  Procedure Laterality Date  . CHOLECYSTECTOMY    . COLONOSCOPY  2010, 2015   one polyp  . PARTIAL HYSTERECTOMY     one ovary remains    Social History  Substance Use Topics  . Smoking status: Never Smoker  . Smokeless tobacco: Never Used  . Alcohol use No     Comment: glass wine every 3-6 months     Medication list has been reviewed and updated.   Physical Exam  Constitutional: She appears well-developed and well-nourished.  Cardiovascular: Normal rate, regular rhythm and normal heart sounds.   Pulmonary/Chest: Effort normal and breath sounds normal. No respiratory distress.  Abdominal: Soft. Bowel sounds are normal. There is tenderness in the suprapubic area. There is no rebound, no guarding  and no CVA tenderness.  Psychiatric: She has a normal mood and affect.  Nursing note and vitals reviewed.   BP 122/64   Pulse 72   Ht 5\' 3"  (1.6 m)   Wt 168 lb 12.8 oz (76.6 kg)   SpO2 97%   BMI 29.90 kg/m   Assessment and Plan: 1. Acute cystitis without hematuria Continue to push fluids Follow up for culture if no improvement - POC urinalysis w  microscopic (non auto)   Meds ordered this encounter  Medications  . ciprofloxacin (CIPRO) 250 MG tablet    Sig: Take 1 tablet (250 mg total) by mouth 2 (two) times daily.    Dispense:  14 tablet    Refill:  0    Halina Maidens, MD [facility]  11/13/2016

## 2016-11-18 DIAGNOSIS — Z85828 Personal history of other malignant neoplasm of skin: Secondary | ICD-10-CM | POA: Diagnosis not present

## 2016-11-18 DIAGNOSIS — L720 Epidermal cyst: Secondary | ICD-10-CM | POA: Diagnosis not present

## 2016-11-18 DIAGNOSIS — Z08 Encounter for follow-up examination after completed treatment for malignant neoplasm: Secondary | ICD-10-CM | POA: Diagnosis not present

## 2016-11-18 DIAGNOSIS — L821 Other seborrheic keratosis: Secondary | ICD-10-CM | POA: Diagnosis not present

## 2016-11-18 DIAGNOSIS — L820 Inflamed seborrheic keratosis: Secondary | ICD-10-CM | POA: Diagnosis not present

## 2016-11-24 ENCOUNTER — Encounter: Payer: Self-pay | Admitting: Family Medicine

## 2016-11-24 ENCOUNTER — Ambulatory Visit (INDEPENDENT_AMBULATORY_CARE_PROVIDER_SITE_OTHER): Payer: Medicare Other | Admitting: Family Medicine

## 2016-11-24 VITALS — BP 138/78 | HR 76 | Resp 16 | Ht 63.0 in | Wt 166.6 lb

## 2016-11-24 DIAGNOSIS — E559 Vitamin D deficiency, unspecified: Secondary | ICD-10-CM

## 2016-11-24 DIAGNOSIS — R2681 Unsteadiness on feet: Secondary | ICD-10-CM | POA: Diagnosis not present

## 2016-11-24 DIAGNOSIS — E538 Deficiency of other specified B group vitamins: Secondary | ICD-10-CM | POA: Diagnosis not present

## 2016-11-24 DIAGNOSIS — E669 Obesity, unspecified: Secondary | ICD-10-CM

## 2016-11-24 DIAGNOSIS — I10 Essential (primary) hypertension: Secondary | ICD-10-CM

## 2016-11-24 DIAGNOSIS — E1140 Type 2 diabetes mellitus with diabetic neuropathy, unspecified: Secondary | ICD-10-CM | POA: Diagnosis not present

## 2016-11-24 DIAGNOSIS — IMO0002 Reserved for concepts with insufficient information to code with codable children: Secondary | ICD-10-CM

## 2016-11-24 DIAGNOSIS — E1165 Type 2 diabetes mellitus with hyperglycemia: Secondary | ICD-10-CM

## 2016-11-26 NOTE — Progress Notes (Signed)
Date:  11/24/2016   Name:  Betty Ibarra   DOB:  Feb 06, 1941   MRN:  [id number]  PCP:  Adline Potter, MD    Chief Complaint: Diabetes (Just finished meds for Abx for UTI on Wed. )   History of Present Illness:  This is a 76 y.o. female seen for two month f/u. Blood work last visit showed a1c 12.4% (9.8% previously), restarted glipizide/metformin bid, seeing diabetic educator, BG 186 today, wants to avoid injectables. HTN on increased lisinopril due to elevated MCR. Weight down 5#, exercising more. Off B12 now but willing to restart at lower dose. Using cane to walk, gait still unstable. UTI 2 wks ago, sxs resolved s/p Cipro.  Review of Systems:  Review of Systems  Constitutional: Negative for chills and fever.  Respiratory: Negative for cough and shortness of breath.   Cardiovascular: Negative for chest pain and leg swelling.  Gastrointestinal: Negative for abdominal pain.  Endocrine: Negative for polydipsia and polyuria.    Patient Active Problem List   Diagnosis Date Noted  . Gait instability 10/08/2016  . Left flank mass 04/30/2016  . Obesity, Class I, BMI 30-34.9 02/06/2016  . Hiatal hernia 02/06/2016  . Vitamin D deficiency 07/12/2015  . Vitamin B12 deficiency 07/12/2015  . IBS (irritable bowel syndrome) 07/11/2015  . Type 2 diabetes mellitus, uncontrolled, with neuropathy ([place]) 07/06/2015  . Hyperlipidemia 07/06/2015  . Hypertension 07/06/2015  . Colon polyp 07/06/2015  . 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 Presence of intraocular lens 05/24/2015  . Pseudophakia of right eye 05/24/2015  . Arthralgia of hip 07/25/2014  . Combined forms of age-related cataract of both eyes 07/12/2014    Prior to Admission medications   Medication Sig Start Date End Date Taking? Authorizing Provider  Cholecalciferol (VITAMIN D3) 2000 units TABS Take 2 tablets by mouth daily.   Yes Historical Provider, MD  glipiZIDE (GLUCOTROL) 5 MG tablet Take 1 tablet (5 mg  total) by mouth 2 (two) times daily before a meal. 02/21/16  Yes Adline Potter, MD  glucose blood test strip Use as instructed 11/07/15  Yes Adline Potter, MD  Lancets [facility] ULTRASOFT) lancets Use as instructed 11/07/15  Yes Adline Potter, MD  lisinopril (PRINIVIL,ZESTRIL) 20 MG tablet Take 1 tablet (20 mg total) by mouth daily. 10/09/16  Yes Adline Potter, MD  metFORMIN (GLUCOPHAGE) 1000 MG tablet Take 1 tablet (1,000 mg total) by mouth 2 (two) times daily with a meal. 03/04/16  Yes Adline Potter, MD  vitamin B-12 (CYANOCOBALAMIN) 1000 MCG tablet Take 0.5 tablets (500 mcg total) by mouth daily. 10/09/16  Yes Adline Potter, MD    Allergies  Allergen Reactions  . Sulfa Antibiotics Itching and Rash  . Penicillins Rash    Past Surgical History:  Procedure Laterality Date  . CHOLECYSTECTOMY    . COLONOSCOPY  2010, 2015   one polyp  . PARTIAL HYSTERECTOMY     one ovary remains    Social History  Substance Use Topics  . Smoking status: Never Smoker  . Smokeless tobacco: Never Used  . Alcohol use No     Comment: glass wine every 3-6 months    Family History  Problem Relation Age of Onset  . Colon cancer Mother   . Diabetes Father   . Diabetes Maternal Grandmother   . Diabetes Paternal Grandmother     Medication list has been reviewed and updated.  Physical Examination: BP 138/78   Pulse 76   Resp 16   Ht 5\' 3"  (1.6 m)  Wt 166 lb 9.6 oz (75.6 kg)   BMI 29.51 kg/m   Physical Exam  Constitutional: She appears well-developed and well-nourished.  Cardiovascular: Normal rate, regular rhythm and normal heart sounds.   Pulmonary/Chest: Effort normal and breath sounds normal.  Musculoskeletal: She exhibits no edema.  Neurological: She is alert.  Skin: Skin is warm and dry.  Psychiatric: She has a normal mood and affect. Her behavior is normal.  Nursing note and vitals reviewed.   Assessment and Plan:  1. Type 2 diabetes mellitus, uncontrolled, with neuropathy  (HCC) Improved control, seeing diabetic educator, repeat a1c next visit, consider statin/asa  2. Essential hypertension Well controlled on increased lisinopril, repeat MCR next visit  3. Obesity, Class I, BMI 30-34.9 Weight improved, cont exercise/weight loss  4. Gait instability Likely due to neuropathy - Ambulatory referral to Physical Therapy  5. Vitamin B12 deficiency Resume supplement at reduced dose  6. Vitamin D deficiency Well controlled on supplement  Return in about 6 weeks (around 01/05/2017).  Satira Anis. [facility]  11/26/2016

## 2016-12-04 ENCOUNTER — Encounter: Payer: Medicare Other | Attending: Family Medicine | Admitting: Dietician

## 2016-12-04 ENCOUNTER — Encounter: Payer: Self-pay | Admitting: Dietician

## 2016-12-04 VITALS — Ht 63.0 in | Wt 166.7 lb

## 2016-12-04 DIAGNOSIS — E1165 Type 2 diabetes mellitus with hyperglycemia: Secondary | ICD-10-CM | POA: Diagnosis not present

## 2016-12-04 DIAGNOSIS — E1140 Type 2 diabetes mellitus with diabetic neuropathy, unspecified: Secondary | ICD-10-CM | POA: Insufficient documentation

## 2016-12-04 DIAGNOSIS — Z713 Dietary counseling and surveillance: Secondary | ICD-10-CM | POA: Diagnosis not present

## 2016-12-04 NOTE — Progress Notes (Signed)
Diabetes Self-Management Education  Visit Type:  Follow-up  Appt. Start Time: 10:30am Appt. End Time: 11:30am 12/04/2016  [name], identified by name and date of birth, is a 76 y.o. female with a diagnosis of Diabetes:  .Type 2 diabetes   ASSESSMENT  Height 5\' 3"  (1.6 m), weight 166 lb 11.2 oz (75.6 kg). Body mass index is 29.53 kg/m.       Diabetes Self-Management Education - 40/08/67 6195      Complications   Last HgB A1C per patient/outside source 12.4 %   How often do you check your blood sugar? 1-2 times/day   Fasting Blood glucose range (mg/dL) 180-200;>200   Have you had a dilated eye exam in the past 12 months? Yes   Have you had a dental exam in the past 12 months? Yes   Are you checking your feet? Yes   How many days per week are you checking your feet? 1     Dietary Intake   Breakfast same as initial visit; patient had made positive diet changes prior to her initial visit.     Exercise   Exercise Type Light (walking / raking leaves)   How many days per week to you exercise? 2   How many minutes per day do you exercise? 20   Total minutes per week of exercise 40     Patient Education   Nutrition management  Role of diet in the treatment of diabetes and the relationship between the three main macronutrients and blood glucose level;Food label reading, portion sizes and measuring food.;Carbohydrate counting;Meal options for control of blood glucose level and chronic complications.   Physical activity and exercise  Role of exercise on diabetes management, blood pressure control and cardiac health.;Helped patient identify appropriate exercises in relation to his/her diabetes, diabetes complications and other health issue.   Acute complications Discussed and identified patients' treatment of hyperglycemia.     Individualized Goals (developed by patient)   Nutrition Follow meal plan discussed;General guidelines for healthy choices and portions discussed   Physical Activity Exercise 3-5 times per week  Discuss appropriate exercises with PT   Medications take my medication as prescribed   Monitoring  test my blood glucose as discussed     Post-Education Assessment   Patient understands the diabetes disease and treatment process. Demonstrates understanding / competency   Patient understands incorporating nutritional management into lifestyle. Demonstrates understanding / competency   Patient undertands incorporating physical activity into lifestyle. Demonstrates understanding / competency   Patient understands using medications safely. Demonstrates understanding / competency   Patient understands monitoring blood glucose, interpreting and using results Demonstrates understanding / competency   Patient understands prevention, detection, and treatment of acute complications. Demonstrates understanding / competency   Patient understands prevention, detection, and treatment of chronic complications. Demonstrates understanding / competency   Patient understands how to develop strategies to promote health/change behavior. Demonstrates understanding / competency     Outcomes   Program Status Completed      Learning Objective:  Patient will have a greater understanding of diabetes self-management. Patient education plan is to attend individual and/or group sessions per assessed needs and concerns.   Plan:   Patient Instructions  Balance meals with protein (1-3 oz), 2-3 servings of carbohydrate (starch, fruit, milk/yogurt) and "free vegetables". Spread 9-10 servings of carbohydrate over 3 meals and 1-2 snacks.  Use food guide plate as a guide to help balance meals.  Include at least 6 oz. of protein food daily. Refer  to list. Read labels for carbohydrate, saturated fat and trans fat.       Expected Outcomes:  Demonstrated interest in learning. Expect positive outcomes  Education material provided: Food Guide Plate, Planning a Balanced  Meal  If problems or questions, patient to contact team via: Thad Ranger  [phone]  Future DSME appointment: - PRN

## 2016-12-04 NOTE — Patient Instructions (Signed)
Balance meals with protein (1-3 oz), 2-3 servings of carbohydrate (starch, fruit, milk/yogurt) and "free vegetables". Spread 9-10 servings of carbohydrate over 3 meals and 1-2 snacks.  Use food guide plate as a guide to help balance meals.  Include at least 6 oz. of protein food daily. Refer to list. Read labels for carbohydrate, saturated fat and trans fat.

## 2016-12-09 ENCOUNTER — Ambulatory Visit: Payer: Medicare Other | Attending: Family Medicine | Admitting: Physical Therapy

## 2016-12-09 DIAGNOSIS — M6281 Muscle weakness (generalized): Secondary | ICD-10-CM | POA: Diagnosis not present

## 2016-12-09 DIAGNOSIS — R2689 Other abnormalities of gait and mobility: Secondary | ICD-10-CM | POA: Diagnosis not present

## 2016-12-09 DIAGNOSIS — M79605 Pain in left leg: Secondary | ICD-10-CM | POA: Diagnosis not present

## 2016-12-09 DIAGNOSIS — M25672 Stiffness of left ankle, not elsewhere classified: Secondary | ICD-10-CM | POA: Insufficient documentation

## 2016-12-09 NOTE — Therapy (Signed)
[facility] [address] [place], [place], [address] Phone: [phone]   Fax:  [phone]  Physical Therapy Evaluation  Patient Details  Name: Betty Ibarra MRN: [id number] Date of Birth: 10-Sep-1941 Referring Provider: Adline Potter, MD  Encounter Date: 12/09/2016      PT End of Session - 12/09/16 1814    Visit Number 1   Number of Visits 8   Date for PT Re-Evaluation 01/09/17   Authorization - Visit Number 1   Authorization - Number of Visits 10   PT Start Time 9476   PT Stop Time 1613   PT Time Calculation (min) 59 min   Equipment Utilized During Treatment Gait belt   Activity Tolerance Patient tolerated treatment well   Behavior During Therapy [facility] for tasks assessed/performed      Past Medical History:  Diagnosis Date  . Arthritis   . Diabetes mellitus without complication ([place])   . Hyperlipidemia   . Hypertension   . S/P partial hysterectomy     Past Surgical History:  Procedure Laterality Date  . CHOLECYSTECTOMY    . COLONOSCOPY  2010, 2015   one polyp  . PARTIAL HYSTERECTOMY     one ovary remains    There were no vitals filed for this visit.       Subjective Assessment - 12/09/16 1801    Subjective Patient is a pleasant 76 year old female who presents to physical therapy for gait instability. In August 2017 pt. twisted her left hip when on hands and knees in her [place] trying to push the seat forward. She also has been having concurrent abdominal pain in her left side that she noticed first when bowling last may in addition to herniation of R abdomen. Pt. had an MRI of her spine and was reports that she was told she had stenosis of her spine but was unsure of what that meant. She feels leg pain at night but the pain has gotten better since last August with it now being a constant 2-3/10. Left leg in hip crease with concurrent numbness and pins and needles in foot was noted. She reports getting fatigued  throughout the day and has neuropathy from her diabetes. She does not have her diabetes under control at the moment but is working towards it.    Pertinent History uncontrolled diabetes, neuropathy, stenosis, hiatal hernia   Limitations Standing;Lifting;Walking;House hold activities   How long can you sit comfortably? comfortably   How long can you stand comfortably? not very long   How long can you walk comfortably? about 2 blocks   Patient Stated Goals return to household chores, walk for longer, decrease pain   Currently in Pain? Yes   Pain Score 3    Pain Location Leg   Pain Orientation Left   Pain Descriptors / Indicators Aching;Burning;Pins and needles;Numbness;Tingling   Pain Type Neuropathic pain   Pain Radiating Towards foot   Pain Onset More than a month ago   Pain Frequency Constant   Aggravating Factors  standing, walking   Pain Relieving Factors laying down       SEE HEP/ handouts for there.ex. Program.       Right Left  Hip flexion 4/5 3+/5  Hip Abduction 4-/5 4-/5  Hip Adduction 4/5 3+/5  Knee Extension  4/5 4-/5  Knee Flexion 4/5 4-/5  DF 4/5 3-/5  PF 4/5 3+/5    LEFS 22/80  BERG: 48/54 with difficulty during single limb stance, tandem  stance, and steppage  Gait: w and w/o SPC: absent toe off (LLE), decreased toe off RLE, bilateral knee hyperextension upon loading, audible slap of foot contact bilaterally, decreased step length of LLE, and decreased stance phase RLE.  Discomfort with PA glides to hip (L) FABER unable to get into position-limited mobility IR and ER limited on left SLR performed bilaterally - Good hamstring length Active df: L -4, R 5 PROM df L-1, R 6 -Slump test   Educated and performed HEP program.         PT Education - 12/09/16 1813    Education provided Yes   Education Details HEP   Person(s) Educated Patient   Methods Explanation;Demonstration;Handout   Comprehension Verbalized understanding;Returned demonstration              PT Long Term Goals - 12/09/16 1827      PT LONG TERM GOAL #1   Title Patient will improve LEFS score to > 40/80 to improve quality of life.    Baseline 3/27: 22/80   Time 4   Period Weeks   Status New     PT LONG TERM GOAL #2   Title Patient will improve BERG score to 54/56 to increase safe ambulation in the community.    Baseline 3/27: 48   Time 4   Period Weeks   Status New     PT LONG TERM GOAL #3   Title Pt. will be able to achieve active neutral DF with LLE to improve gait mechanics   Baseline AROM: -4   Time 4   Period Weeks   Status New     PT LONG TERM GOAL #4   Title Patient will demonstrate 4/5 strength in LLE to increase functional mobility and safety in community ambulation.    Baseline LLE grossly 3+/5   Time 4   Period Weeks   Status New            Plan - 12/09/16 1815    Clinical Impression Statement Patient is a pleasant 76 year old who presents to her physical therapy evaluation with SPC for gait instability. Bilateral LE weakness with L>R with difficulty performing active DF. LEFS 22/80. BERG: 48/54 with difficulty during single limb stance, tandem stance, and steppage. Gait deviations were noted with and without utilization of SPC including absent toe off (LLE), decreased toe off RLE, bilateral knee hyperextension upon loading, audible slap of foot contact bilaterally, decreased step length of LLE, and decreased stance phase RLE. Weak quadriceps and LE musculature affecting gait mechanics. Hamstring length functional with limited IR/ER noted in supine. SLR was performed bilaterally. DF limited bilaterally with L>R and decreased ability to perform DF actively with RLE. Patient presents with generalized weakness and balance deficits leading to gait deviations. Patient will benefit from skilled physical therapy to address strength deficits, improve balance, and improve gait mechanics for increased safety while ambulating and improved quality of  life.    Rehab Potential Fair   Clinical Impairments Affecting Rehab Potential diabetes, hiatial hernia   PT Frequency 1x / week   PT Duration 4 weeks   PT Treatment/Interventions ADLs/Self Care Home Management;Cryotherapy;Electrical Stimulation;DME Instruction;Ultrasound;Traction;Moist Heat;Iontophoresis 4mg /ml Dexamethasone;Gait training;Stair training;Functional mobility training;Therapeutic activities;Therapeutic exercise;Balance training;Orthotic Fit/Training;Patient/family education;Neuromuscular re-education;Manual techniques;Compression bandaging;Taping;Splinting;Energy conservation;Passive range of motion   PT Next Visit Plan review HEP, DF exercises   PT Home Exercise Plan see sheet   Consulted and Agree with Plan of Care Patient      Patient will benefit from skilled therapeutic  intervention in order to improve the following deficits and impairments:  Abnormal gait, Decreased activity tolerance, Decreased balance, Decreased endurance, Decreased range of motion, Difficulty walking, Decreased strength, Impaired flexibility, Impaired perceived functional ability, Improper body mechanics, Postural dysfunction, Pain  Visit Diagnosis: Muscle weakness (generalized)  Other abnormalities of gait and mobility  Stiffness of left ankle, not elsewhere classified  Pain of left leg      G-Codes - December 26, 2016 1849    Functional Assessment Tool Used (Outpatient Only) Clinical impression/ gait difficulty/ muscle weakness/ balance issues/ LEFS   Functional Limitation Mobility: Walking and moving around   Mobility: Walking and Moving Around Current Status ([id number]) At least 20 percent but less than 40 percent impaired, limited or restricted   Mobility: Walking and Moving Around Goal Status [phone]) At least 1 percent but less than 20 percent impaired, limited or restricted       Problem List Patient Active Problem List   Diagnosis Date Noted  . Gait instability 10/08/2016  . Left flank mass  04/30/2016  . Obesity, Class I, BMI 30-34.9 02/06/2016  . Hiatal hernia 02/06/2016  . Vitamin D deficiency 07/12/2015  . Vitamin B12 deficiency 07/12/2015  . IBS (irritable bowel syndrome) 07/11/2015  . Type 2 diabetes mellitus, uncontrolled, with neuropathy ([place]) 07/06/2015  . Hyperlipidemia 07/06/2015  . Hypertension 07/06/2015  . Colon polyp 07/06/2015  . 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 Presence of intraocular lens 05/24/2015  . Pseudophakia of right eye 05/24/2015  . Arthralgia of hip 07/25/2014  . Combined forms of age-related cataract of both eyes 07/12/2014   Pura Spice, PT, DPT # [place], SPT 12/10/2016, 8:56 AM  [facility] [address] [place], [place], [address] Phone: [phone]   Fax:  [phone]  Name: Mianna Iezzi MRN: [id number] Date of Birth: 09/30/1940

## 2016-12-10 ENCOUNTER — Encounter: Payer: Self-pay | Admitting: Physical Therapy

## 2016-12-10 NOTE — Addendum Note (Signed)
Addended by: Pura Spice on: 12/10/2016 08:59 AM   Modules accepted: Orders

## 2016-12-16 ENCOUNTER — Ambulatory Visit: Payer: Medicare Other | Attending: Family Medicine | Admitting: Physical Therapy

## 2016-12-16 DIAGNOSIS — M25672 Stiffness of left ankle, not elsewhere classified: Secondary | ICD-10-CM | POA: Diagnosis not present

## 2016-12-16 DIAGNOSIS — M6281 Muscle weakness (generalized): Secondary | ICD-10-CM | POA: Insufficient documentation

## 2016-12-16 DIAGNOSIS — M79605 Pain in left leg: Secondary | ICD-10-CM | POA: Insufficient documentation

## 2016-12-16 DIAGNOSIS — R2689 Other abnormalities of gait and mobility: Secondary | ICD-10-CM | POA: Diagnosis not present

## 2016-12-16 NOTE — Therapy (Cosign Needed)
[facility] [address] [place], [place], [address] Phone: [phone]   Fax:  [phone]  Physical Therapy Treatment  Patient Details  Name: Betty Ibarra MRN: [id number] Date of Birth: 1940/11/06 Referring Provider: Adline Potter, MD  Encounter Date: 12/16/2016      PT End of Session - 12/16/16 1758    Visit Number 2   Number of Visits 8   Date for PT Re-Evaluation 01/09/17   Authorization - Visit Number 2   Authorization - Number of Visits 10   PT Start Time 1508   PT Stop Time 1558   PT Time Calculation (min) 50 min   Equipment Utilized During Treatment Gait belt   Activity Tolerance Patient tolerated treatment well   Behavior During Therapy [employer] for tasks assessed/performed      Past Medical History:  Diagnosis Date  . Arthritis   . Diabetes mellitus without complication ([place])   . Hyperlipidemia   . Hypertension   . S/P partial hysterectomy     Past Surgical History:  Procedure Laterality Date  . CHOLECYSTECTOMY    . COLONOSCOPY  2010, 2015   one polyp  . PARTIAL HYSTERECTOMY     one ovary remains    There were no vitals filed for this visit.      Subjective Assessment - 12/16/16 1755    Subjective Patient presented to physical thearpy with SPC. She reports having decreased LLE pain and was able to perform cleaning activities the day prior for half a day. Tasks requiring her to be on the floor or reach were difficult and she reports having to rest while cleaning.    Pertinent History uncontrolled diabetes, neuropathy, stenosis, hiatal hernia   Limitations Standing;Lifting;Walking;House hold activities   How long can you sit comfortably? comfortably   How long can you stand comfortably? not very long   How long can you walk comfortably? about 2 blocks   Patient Stated Goals return to household chores, walk for longer, decrease pain   Currently in Pain? No/denies   Pain Onset More than a month ago     Manual LLE LAD 2x40 sec in supine LLE SAD with belt and knee supported on PT's shoulder with pt. In supine for multiple angles 5x30 seconds. LLE SAD in supine with belt and knee at 30 degrees flexion and medial stabilization force provided for lateral distraction 3x30 seconds. Supine LLE hamstring neural glides with SLR 2x30 seconds Grade I-III mobilizations of L carpals, MTPs, and toes. Hypomobile initially, pt. Reported pain relief with repetition Ankle mobilizations (PA, AP, med, lat,) decreased hypomobility with repetition. 20 sec each direction DF PROM 8x 45 second holds with stabilization provided at tibia in supine and sitting.   TherEx Heel raises 10x bilaterally with UE support Single heel raises 7x L with extreme UE support, 10x R with decreased UE support. Decreased excursion with L.  Toe raise and heel raise rockers 10x, decreased clearance with LLE.  Marble transfers with L toes to promote intrinsic foot musculature strengthening 2x10 marbles. Ankle pumps 10x each leg in seated position Seated spelling of words dictated by PT for ankle mobility. Single limb making circles on Green Airex with contralateral limb for support and UE support in // bars. 15 circles each leg, more difficult for pt. To control LLE directionality. Toe taps on 6" steps with visual cue to tap piece of tape on top. More difficulty with L>R, but good control with task orientation.   Pt. Educated  on slowly lowering foot to ground when ambulating in a rocker motion to decrease foot slap, practiced in // bars, demonstrated understanding ambulating 48 ft with SPC in clinic.  Pt. will benefit from skilled physical therapy services to address strength deficits, improve balance, and improve gait mechanics for increased safety while ambulating and improved quality of life       PT Long Term Goals - 12/09/16 1827      PT LONG TERM GOAL #1   Title Patient will improve LEFS score to > 40/80 to improve quality of  life.    Baseline 3/27: 22/80   Time 4   Period Weeks   Status New     PT LONG TERM GOAL #2   Title Patient will improve BERG score to 54/56 to increase safe ambulation in the community.    Baseline 3/27: 48   Time 4   Period Weeks   Status New     PT LONG TERM GOAL #3   Title Pt. will be able to achieve active neutral DF with LLE to improve gait mechanics   Baseline AROM: -4   Time 4   Period Weeks   Status New     PT LONG TERM GOAL #4   Title Patient will demonstrate 4/5 strength in LLE to increase functional mobility and safety in community ambulation.    Baseline LLE grossly 3+/5   Time 4   Period Weeks   Status New               Plan - 12/16/16 1754    Clinical Impression Statement Patient presented to physical therapy with SPC and improved pain levels of LLE.  Audible foot slap at initial contact/loading response with ambulation heard with LLE and SPC. Manual tx provided relief of symptoms and demonstrated carryover of improved gait mechanics with less audible foot slap during ambulation. Hypomobility of ankle, foot, and hip joint of LLE improved with mobilizations/distraction. Pt. has weak LLE musculature, especially DF's and has foot slap due to the eccentric demands on the dorsiflexor musculature. Strengthening of intrinsic foot musculature and LE musculature after correcting bony alignment was beneficial and elicited improved gait mechanics. Pt. was able to demonstrate proper rocker mechanism in // bars and in clinic with [facility] with quick fatigue. Pt. will benefit from skilled physical therapy services to address strength deficits, improve balance, and improve gait mechanics for increased safety while ambulating and improved quality of life.    Rehab Potential Fair   Clinical Impairments Affecting Rehab Potential diabetes, hiatial hernia   PT Frequency 1x / week   PT Duration 4 weeks   PT Treatment/Interventions ADLs/Self Care Home Management;Cryotherapy;Electrical  Stimulation;DME Instruction;Ultrasound;Traction;Moist Heat;Iontophoresis 4mg /ml Dexamethasone;Gait training;Stair training;Functional mobility training;Therapeutic activities;Therapeutic exercise;Balance training;Orthotic Fit/Training;Patient/family education;Neuromuscular re-education;Manual techniques;Compression bandaging;Taping;Splinting;Energy conservation;Passive range of motion   PT Next Visit Plan eccentric DF exercises, distraction/mobilizations of L ankle/foot/hip   PT Home Exercise Plan see sheet   Consulted and Agree with Plan of Care Patient      Patient will benefit from skilled therapeutic intervention in order to improve the following deficits and impairments:  Abnormal gait, Decreased activity tolerance, Decreased balance, Decreased endurance, Decreased range of motion, Difficulty walking, Decreased strength, Impaired flexibility, Impaired perceived functional ability, Improper body mechanics, Postural dysfunction, Pain  Visit Diagnosis: Muscle weakness (generalized)  Other abnormalities of gait and mobility  Stiffness of left ankle, not elsewhere classified  Pain of left leg     Problem List Patient Active Problem List  Diagnosis Date Noted  . Gait instability 10/08/2016  . Left flank mass 04/30/2016  . Obesity, Class I, BMI 30-34.9 02/06/2016  . Hiatal hernia 02/06/2016  . Vitamin D deficiency 07/12/2015  . Vitamin B12 deficiency 07/12/2015  . IBS (irritable bowel syndrome) 07/11/2015  . Type 2 diabetes mellitus, uncontrolled, with neuropathy ([place]) 07/06/2015  . Hyperlipidemia 07/06/2015  . Hypertension 07/06/2015  . Colon polyp 07/06/2015  . 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 Presence of intraocular lens 05/24/2015  . Pseudophakia of right eye 05/24/2015  . Arthralgia of hip 07/25/2014  . Combined forms of age-related cataract of both eyes 07/12/2014   Varney Biles, PT, DPT This entire session was performed under direct  supervision and direction of Wayne Both, PT (a licensed therapist) who has personally read, edited and approve of the note as written.  Janna Arch, SPT 12/16/2016, 6:02 PM  [facility] [address] [place], [place], [address] Phone: [phone]   Fax:  [phone]  Name: Betty Ibarra MRN: [id number] Date of Birth: 1941-01-26

## 2016-12-16 NOTE — Therapy (Deleted)
[facility] [address] [place], [place], [address] Phone: [phone]   Fax:  [phone]  Physical Therapy Treatment  Patient Details  Name: Betty Ibarra MRN: [id number] Date of Birth: [birth year] Referring Provider: Adline Potter, MD  Encounter Date: 12/16/2016      PT End of Session - 12/16/16 1758    Visit Number 2   Number of Visits 8   Date for PT Re-Evaluation 01/09/17   Authorization - Visit Number 2   Authorization - Number of Visits 10   PT Start Time 1508   PT Stop Time 1558   PT Time Calculation (min) 50 min   Equipment Utilized During Treatment Gait belt   Activity Tolerance Patient tolerated treatment well   Behavior During Therapy [facility] for tasks assessed/performed      Past Medical History:  Diagnosis Date  . Arthritis   . Diabetes mellitus without complication ([place])   . Hyperlipidemia   . Hypertension   . S/P partial hysterectomy     Past Surgical History:  Procedure Laterality Date  . CHOLECYSTECTOMY    . COLONOSCOPY  2010, 2015   one polyp  . PARTIAL HYSTERECTOMY     one ovary remains    There were no vitals filed for this visit.      Subjective Assessment - 12/16/16 1755    Subjective Patient presented to physical thearpy with SPC. She reports having decreased LLE pain and was able to perform cleaning activities the day prior for half a day. Tasks requiring her to be on the floor or reach were difficult and she reports having to rest while cleaning.    Pertinent History uncontrolled diabetes, neuropathy, stenosis, hiatal hernia   Limitations Standing;Lifting;Walking;House hold activities   How long can you sit comfortably? comfortably   How long can you stand comfortably? not very long   How long can you walk comfortably? about 2 blocks   Patient Stated Goals return to household chores, walk for longer, decrease pain   Currently in Pain? No/denies   Pain Onset More than a month ago     Manual LLE LAD 2x40 sec in supine LLE SAD with belt and knee supported on PT's shoulder with pt. In supine for multiple angles 5x30 seconds. LLE SAD in supine with belt and knee at 30 degrees flexion and medial stabilization force provided for lateral distraction 3x30 seconds. Supine LLE hamstring neural glides with SLR 2x30 seconds Grade I-III mobilizations of L carpals, MTPs, and toes. Hypomobile initially, pt. Reported pain relief with repetition Ankle mobilizations (PA, AP, med, lat,) decreased hypomobility with repetition. 20 sec each direction DF PROM 8x 45 second holds with stabilization provided at tibia in supine and sitting.   TherEx Heel raises 10x bilaterally with UE support Single heel raises 7x L with extreme UE support, 10x R with decreased UE support. Decreased excursion with L.  Toe raise and heel raise rockers 10x, decreased clearance with LLE.  Marble transfers with L toes to promote intrinsic foot musculature strengthening 2x10 marbles. Ankle pumps 10x each leg in seated position Seated spelling of words dictated by PT for ankle mobility. Single limb making circles on Green Airex with contralateral limb for support and UE support in // bars. 15 circles each leg, more difficult for pt. To control LLE directionality. Toe taps on 6" steps with visual cue to tap piece of tape on top. More difficulty with L>R, but good control with task orientation.   Pt. Educated  on slowly lowering foot to ground when ambulating in a rocker motion to decrease foot slap, practiced in // bars, demonstrated understanding ambulating 48 ft with SPC in clinic.  Pt. will benefit from skilled physical therapy services to address strength deficits, improve balance, and improve gait mechanics for increased safety while ambulating and improved quality of life       PT Long Term Goals - 12/09/16 1827      PT LONG TERM GOAL #1   Title Patient will improve LEFS score to > 40/80 to improve quality of  life.    Baseline 3/27: 22/80   Time 4   Period Weeks   Status New     PT LONG TERM GOAL #2   Title Patient will improve BERG score to 54/56 to increase safe ambulation in the community.    Baseline 3/27: 48   Time 4   Period Weeks   Status New     PT LONG TERM GOAL #3   Title Pt. will be able to achieve active neutral DF with LLE to improve gait mechanics   Baseline AROM: -4   Time 4   Period Weeks   Status New     PT LONG TERM GOAL #4   Title Patient will demonstrate 4/5 strength in LLE to increase functional mobility and safety in community ambulation.    Baseline LLE grossly 3+/5   Time 4   Period Weeks   Status New               Plan - 12/16/16 1754    Clinical Impression Statement Patient presented to physical therapy with SPC and improved pain levels of LLE.  Audible foot slap at initial contact/loading response with ambulation heard with LLE and SPC. Manual tx provided relief of symptoms and demonstrated carryover of improved gait mechanics with less audible foot slap during ambulation. Hypomobility of ankle, foot, and hip joint of LLE improved with mobilizations/distraction. Pt. has weak LLE musculature, especially DF's and has foot slap due to the eccentric demands on the dorsiflexor musculature. Strengthening of intrinsic foot musculature and LE musculature after correcting bony alignment was beneficial and elicited improved gait mechanics. Pt. was able to demonstrate proper rocker mechanism in // bars and in clinic with [facility] with quick fatigue. Pt. will benefit from skilled physical therapy services to address strength deficits, improve balance, and improve gait mechanics for increased safety while ambulating and improved quality of life.    Rehab Potential Fair   Clinical Impairments Affecting Rehab Potential diabetes, hiatial hernia   PT Frequency 1x / week   PT Duration 4 weeks   PT Treatment/Interventions ADLs/Self Care Home Management;Cryotherapy;Electrical  Stimulation;DME Instruction;Ultrasound;Traction;Moist Heat;Iontophoresis 4mg /ml Dexamethasone;Gait training;Stair training;Functional mobility training;Therapeutic activities;Therapeutic exercise;Balance training;Orthotic Fit/Training;Patient/family education;Neuromuscular re-education;Manual techniques;Compression bandaging;Taping;Splinting;Energy conservation;Passive range of motion   PT Next Visit Plan eccentric DF exercises, distraction/mobilizations of L ankle/foot/hip   PT Home Exercise Plan see sheet   Consulted and Agree with Plan of Care Patient      Patient will benefit from skilled therapeutic intervention in order to improve the following deficits and impairments:  Abnormal gait, Decreased activity tolerance, Decreased balance, Decreased endurance, Decreased range of motion, Difficulty walking, Decreased strength, Impaired flexibility, Impaired perceived functional ability, Improper body mechanics, Postural dysfunction, Pain  Visit Diagnosis: Muscle weakness (generalized)  Other abnormalities of gait and mobility  Stiffness of left ankle, not elsewhere classified  Pain of left leg     Problem List Patient Active Problem List  Diagnosis Date Noted  . Gait instability 10/08/2016  . Left flank mass 04/30/2016  . Obesity, Class I, BMI 30-34.9 02/06/2016  . Hiatal hernia 02/06/2016  . Vitamin D deficiency 07/12/2015  . Vitamin B12 deficiency 07/12/2015  . IBS (irritable bowel syndrome) 07/11/2015  . Type 2 diabetes mellitus, uncontrolled, with neuropathy ([place]) 07/06/2015  . Hyperlipidemia 07/06/2015  . Hypertension 07/06/2015  . Colon polyp 07/06/2015  . 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 Presence of intraocular lens 05/24/2015  . Pseudophakia of right eye 05/24/2015  . Arthralgia of hip 07/25/2014  . Combined forms of age-related cataract of both eyes 07/12/2014    Janna Arch 12/16/2016, 6:01 PM  [facility] [address] [place], [place], [address] Phone: [phone]   Fax:  [phone]  Name: Yocelyn Brocious MRN: [id number] Date of Birth: [birth year]

## 2016-12-19 ENCOUNTER — Ambulatory Visit (INDEPENDENT_AMBULATORY_CARE_PROVIDER_SITE_OTHER): Payer: Medicare Other | Admitting: Family Medicine

## 2016-12-19 ENCOUNTER — Encounter: Payer: Self-pay | Admitting: Family Medicine

## 2016-12-19 VITALS — BP 140/82 | HR 70 | Temp 98.2°F | Resp 16 | Ht 63.0 in | Wt 166.2 lb

## 2016-12-19 DIAGNOSIS — R2681 Unsteadiness on feet: Secondary | ICD-10-CM | POA: Diagnosis not present

## 2016-12-19 DIAGNOSIS — E1165 Type 2 diabetes mellitus with hyperglycemia: Secondary | ICD-10-CM | POA: Diagnosis not present

## 2016-12-19 DIAGNOSIS — E669 Obesity, unspecified: Secondary | ICD-10-CM | POA: Diagnosis not present

## 2016-12-19 DIAGNOSIS — E785 Hyperlipidemia, unspecified: Secondary | ICD-10-CM | POA: Diagnosis not present

## 2016-12-19 DIAGNOSIS — E1140 Type 2 diabetes mellitus with diabetic neuropathy, unspecified: Secondary | ICD-10-CM

## 2016-12-19 DIAGNOSIS — I10 Essential (primary) hypertension: Secondary | ICD-10-CM

## 2016-12-19 DIAGNOSIS — R1011 Right upper quadrant pain: Secondary | ICD-10-CM | POA: Diagnosis not present

## 2016-12-19 DIAGNOSIS — IMO0002 Reserved for concepts with insufficient information to code with codable children: Secondary | ICD-10-CM

## 2016-12-19 NOTE — Progress Notes (Signed)
Date:  12/19/2016   Name:  Betty Ibarra   DOB:  Nov 08, 1940   MRN:  [id number]  PCP:  Adline Potter, MD    Chief Complaint: Abdominal Pain (right side x 2 months : Denies dysuria or any UTI symptoms. Denies fever. )   History of Present Illness:  This is a 76 y.o. female seen in three month f/u. 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 T2DM with a1c 12.4% in January, on increased glipizide/metformin, has seen diabetic educator, home BG's below 200, weight the same. On increased lisinopril dose due to elevated MCR. Seeing PT for gait instability, helping. Taking vit D supplement but has not restarted B12.  Review of Systems:  Review of Systems  Constitutional: Negative for appetite change, chills and fever.  Respiratory: Negative for cough and shortness of breath.   Cardiovascular: Negative for chest pain and leg swelling.  Gastrointestinal: Negative for blood in stool.  Endocrine: Negative for polydipsia and polyuria.  Genitourinary: Negative for dysuria.  Neurological: Negative for syncope and light-headedness.    Patient Active Problem List   Diagnosis Date Noted  . Gait instability 10/08/2016  . Left flank mass 04/30/2016  . Obesity, Class I, BMI 30-34.9 02/06/2016  . Hiatal hernia 02/06/2016  . Vitamin D deficiency 07/12/2015  . Vitamin B12 deficiency 07/12/2015  . IBS (irritable bowel syndrome) 07/11/2015  . Type 2 diabetes mellitus, uncontrolled, with neuropathy ([place]) 07/06/2015  . Hyperlipidemia 07/06/2015  . Hypertension 07/06/2015  . Colon polyp 07/06/2015  . 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 Presence of intraocular lens 05/24/2015  . Pseudophakia of right eye 05/24/2015  . Arthralgia of hip 07/25/2014  . Combined forms of age-related cataract of both eyes 07/12/2014    Prior to Admission  medications   Medication Sig Start Date End Date Taking? Authorizing Provider  Cholecalciferol (VITAMIN D3) 2000 units TABS Take 2 tablets by mouth daily.   Yes Historical Provider, MD  glipiZIDE (GLUCOTROL) 5 MG tablet Take 1 tablet (5 mg total) by mouth 2 (two) times daily before a meal. 02/21/16  Yes Adline Potter, MD  glucose blood test strip Use as instructed 11/07/15  Yes Adline Potter, MD  Lancets [facility] ULTRASOFT) lancets Use as instructed 11/07/15  Yes Adline Potter, MD  lisinopril (PRINIVIL,ZESTRIL) 20 MG tablet Take 1 tablet (20 mg total) by mouth daily. 10/09/16  Yes Adline Potter, MD  metFORMIN (GLUCOPHAGE) 1000 MG tablet Take 1 tablet (1,000 mg total) by mouth 2 (two) times daily with a meal. 03/04/16  Yes Adline Potter, MD  vitamin B-12 (CYANOCOBALAMIN) 1000 MCG tablet Take 0.5 tablets (500 mcg total) by mouth daily. 10/09/16  Yes Adline Potter, MD    Allergies  Allergen Reactions  . Sulfa Antibiotics Itching and Rash  . Penicillins Rash    Past Surgical History:  Procedure Laterality Date  . CHOLECYSTECTOMY    . COLONOSCOPY  2010, 2015   one polyp  . PARTIAL HYSTERECTOMY     one ovary remains    Social History  Substance Use Topics  . Smoking status: Never Smoker  . Smokeless tobacco: Never Used  . Alcohol use No     Comment: glass wine every 3-6 months    Family History  Problem Relation Age of Onset  . Colon cancer Mother   . Diabetes Father   . Diabetes Maternal Grandmother   .  Diabetes Paternal Grandmother     Medication list has been reviewed and updated.  Physical Examination: BP 140/82   Pulse 70   Temp 98.2 F (36.8 C) (Oral)   Resp 16   Ht 5\' 3"  (1.6 m)   Wt 166 lb 3.2 oz (75.4 kg)   SpO2 98%   BMI 29.44 kg/m   Physical Exam  Constitutional: She appears well-developed and well-nourished. No distress.  Cardiovascular: Normal rate, regular rhythm and normal heart sounds.   Pulmonary/Chest: Effort normal and breath sounds normal.   Abdominal: Soft. She exhibits no distension and no mass. There is no guarding.  Mild diffuse tenderness  Musculoskeletal: She exhibits no edema.  Neurological: She is alert.  Skin: Skin is warm and dry. She is not diaphoretic.  Psychiatric: She has a normal mood and affect. Her behavior is normal.  Nursing note and vitals reviewed.   Assessment and Plan:  1. Type 2 diabetes mellitus, uncontrolled, with neuropathy (HCC) On increased glipizide/metformin and lisinopril, insurance will not cover a1c until end of month - Urine Microalbumin w/creat. ratio  2. RUQ abdominal pain Likely IBS but with abnormal pancreas on CT in October recommend repeat CT, pt wishes to defer unless pain worsens/persists  3. Essential hypertension Adequate control on increased lisinopril - Comprehensive Metabolic Panel (CMET)  4. Hyperlipidemia, unspecified hyperlipidemia type Consider statin/asa - Lipid Profile  5. Gait instability Seeing PT, helping  6. Obesity, Class I, BMI 30-34.9 Weight stable, has seen diabetic educator  Return in about 3 months (around 03/20/2017). Call for CT if abdominal pain worsens/persists  Satira Anis. [facility]  12/19/2016

## 2016-12-20 LAB — COMPREHENSIVE METABOLIC PANEL
ALK PHOS: 71 IU/L (ref 39–117)
ALT: 15 IU/L (ref 0–32)
AST: 17 IU/L (ref 0–40)
Albumin/Globulin Ratio: 1.6 (ref 1.2–2.2)
Albumin: 4.1 g/dL (ref 3.5–4.8)
BUN/Creatinine Ratio: 17 (ref 12–28)
BUN: 16 mg/dL (ref 8–27)
Bilirubin Total: 0.2 mg/dL (ref 0.0–1.2)
CO2: 25 mmol/L (ref 18–29)
Calcium: 9.4 mg/dL (ref 8.7–10.3)
Chloride: 92 mmol/L — ABNORMAL LOW (ref 96–106)
Creatinine, Ser: 0.93 mg/dL (ref 0.57–1.00)
GFR calc Af Amer: 69 mL/min/{1.73_m2} (ref >59)
GFR calc non Af Amer: 59 mL/min/{1.73_m2} — ABNORMAL LOW (ref >59)
GLOBULIN, TOTAL: 2.5 g/dL (ref 1.5–4.5)
GLUCOSE: 260 mg/dL — AB (ref 65–99)
Potassium: 5 mmol/L (ref 3.5–5.2)
SODIUM: 135 mmol/L (ref 134–144)
Total Protein: 6.6 g/dL (ref 6.0–8.5)

## 2016-12-20 LAB — LIPID PANEL
CHOLESTEROL TOTAL: 262 mg/dL — AB (ref 100–199)
Chol/HDL Ratio: 6.6 ratio — ABNORMAL HIGH (ref 0.0–4.4)
HDL: 40 mg/dL (ref >39)
TRIGLYCERIDES: 486 mg/dL — AB (ref 0–149)

## 2016-12-20 LAB — MICROALBUMIN / CREATININE URINE RATIO

## 2016-12-22 ENCOUNTER — Telehealth: Payer: Self-pay

## 2016-12-22 ENCOUNTER — Other Ambulatory Visit: Payer: Self-pay

## 2016-12-22 DIAGNOSIS — E1100 Type 2 diabetes mellitus with hyperosmolarity without nonketotic hyperglycemic-hyperosmolar coma (NKHHC): Secondary | ICD-10-CM

## 2016-12-23 ENCOUNTER — Ambulatory Visit: Payer: Medicare Other | Admitting: Physical Therapy

## 2016-12-23 DIAGNOSIS — R2689 Other abnormalities of gait and mobility: Secondary | ICD-10-CM | POA: Diagnosis not present

## 2016-12-23 DIAGNOSIS — M25672 Stiffness of left ankle, not elsewhere classified: Secondary | ICD-10-CM

## 2016-12-23 DIAGNOSIS — E1100 Type 2 diabetes mellitus with hyperosmolarity without nonketotic hyperglycemic-hyperosmolar coma (NKHHC): Secondary | ICD-10-CM | POA: Diagnosis not present

## 2016-12-23 DIAGNOSIS — M79605 Pain in left leg: Secondary | ICD-10-CM | POA: Diagnosis not present

## 2016-12-23 DIAGNOSIS — M6281 Muscle weakness (generalized): Secondary | ICD-10-CM

## 2016-12-23 LAB — MICROALBUMIN, URINE

## 2016-12-23 NOTE — Therapy (Signed)
[facility] [address]. [place], [place], [address] Phone: [phone]   Fax:  [phone]  Physical Therapy Treatment  Patient Details  Name: Betty Ibarra MRN: [id number] Date of Birth: 1941-05-27 Referring Provider: Adline Potter, MD  Encounter Date: 12/23/2016      PT End of Session - 12/23/16 1619    Visit Number 3   Number of Visits 8   Date for PT Re-Evaluation 01/09/17   Authorization - Visit Number 3   Authorization - Number of Visits 10   PT Start Time 7169   PT Stop Time 1602   PT Time Calculation (min) 48 min   Equipment Utilized During Treatment Gait belt   Activity Tolerance Patient tolerated treatment well   Behavior During Therapy [facility] for tasks assessed/performed      Past Medical History:  Diagnosis Date  . Arthritis   . Diabetes mellitus without complication ([place])   . Hyperlipidemia   . Hypertension   . S/P partial hysterectomy     Past Surgical History:  Procedure Laterality Date  . CHOLECYSTECTOMY    . COLONOSCOPY  2010, 2015   one polyp  . PARTIAL HYSTERECTOMY     one ovary remains    There were no vitals filed for this visit.      Subjective Assessment - 12/23/16 1614    Subjective Patient has been feeling much better after last session. She pushed herself a little too hard this weekend and is a little sore now.    Pertinent History uncontrolled diabetes, neuropathy, stenosis, hiatal hernia   Limitations Standing;Lifting;Walking;House hold activities   How long can you sit comfortably? comfortably   How long can you stand comfortably? not very long   How long can you walk comfortably? about 2 blocks   Patient Stated Goals return to household chores, walk for longer, decrease pain   Currently in Pain? Yes   Pain Score 1    Pain Location Leg   Pain Orientation Left   Pain Descriptors / Indicators Aching   Pain Onset More than a month ago       Manual LLE LAD 4x30 sec in  supine LLE SAD with belt and knee supported on PT's shoulder with pt. In supine for multiple angles 5x30 seconds. LLE SAD in supine with belt and knee at 30 degrees flexion and medial stabilization force provided for lateral distraction 3x30 seconds. Supine LLE hamstring neural glides with SLR 1x60 seconds Grade I-III mobilizations of L carpals, MTPs, and toes. Hypomobile initially, pt. Reported pain relief with repetition Ankle mobilizations ( AP, med, lat,) decreased hypomobility with repetition. 20 sec each direction DF PROM 4x 20 second holds with stabilization provided at tibia in supine  TherEx Toe taps on 6" steps with visual cue to tap piece of tape on top. More difficulty with L>R, but good control with task orientation.  Step over 3" step heel tap and return to initial location (step forward and back with single limb support and single UE support) Controlled eccentric dorsiflexion  Airex pad balance beam: side stepping 4x, forward steps 6x, standing 60 seconds focusing on heel and toe raises. Larges steps with eccentric control to decrease audible foot slap during initial contact phase 6x Ambulating 48 ftx2 with SPC to eccentrically lower foot to ground in a rocker motion to decrease foot slap.    Pt. will benefit from skilled physical therapy services to address strength deficits, improve balance, and improve gait mechanics for increased  safety while ambulating and improved quality of life        PT Education - 12/23/16 1618    Education provided Yes   Education Details importance of compliance with HEP, eccentric DF   Person(s) Educated Patient   Methods Explanation   Comprehension Verbalized understanding             PT Long Term Goals - 12/09/16 1827      PT LONG TERM GOAL #1   Title Patient will improve LEFS score to > 40/80 to improve quality of life.    Baseline 3/27: 22/80   Time 4   Period Weeks   Status New     PT LONG TERM GOAL #2   Title Patient  will improve BERG score to 54/56 to increase safe ambulation in the community.    Baseline 3/27: 48   Time 4   Period Weeks   Status New     PT LONG TERM GOAL #3   Title Pt. will be able to achieve active neutral DF with LLE to improve gait mechanics   Baseline AROM: -4   Time 4   Period Weeks   Status New     PT LONG TERM GOAL #4   Title Patient will demonstrate 4/5 strength in LLE to increase functional mobility and safety in community ambulation.    Baseline LLE grossly 3+/5   Time 4   Period Weeks   Status New            Plan - 12/23/16 1622    Clinical Impression Statement Patient has improved pain levels with distraction and manual tx relieving symptoms. Audible foot slap at initial contact/loading response during ambulation was decreased with verbal cueing and after interventions focusing on deliberate df eccentric control. Weak LLE and poor body control contributes to foot slap and pt. continues to progress with body mechanics during exercises. Patient will benefit from skilled physical therapy services to address strength deficits, improve balance, and improve gait mechanics for increased safety while ambulating and improved quality of life.    Rehab Potential Fair   Clinical Impairments Affecting Rehab Potential diabetes, hiatial hernia   PT Frequency 1x / week   PT Duration 4 weeks   PT Treatment/Interventions ADLs/Self Care Home Management;Cryotherapy;Electrical Stimulation;DME Instruction;Ultrasound;Traction;Moist Heat;Iontophoresis 4mg /ml Dexamethasone;Gait training;Stair training;Functional mobility training;Therapeutic activities;Therapeutic exercise;Balance training;Orthotic Fit/Training;Patient/family education;Neuromuscular re-education;Manual techniques;Compression bandaging;Taping;Splinting;Energy conservation;Passive range of motion   PT Next Visit Plan RECERT vs DC based on insurance   PT Home Exercise Plan see sheet   Consulted and Agree with Plan of Care  Patient      Patient will benefit from skilled therapeutic intervention in order to improve the following deficits and impairments:  Abnormal gait, Decreased activity tolerance, Decreased balance, Decreased endurance, Decreased range of motion, Difficulty walking, Decreased strength, Impaired flexibility, Impaired perceived functional ability, Improper body mechanics, Postural dysfunction, Pain  Visit Diagnosis: Muscle weakness (generalized)  Other abnormalities of gait and mobility  Stiffness of left ankle, not elsewhere classified  Pain of left leg     Problem List Patient Active Problem List   Diagnosis Date Noted  . Gait instability 10/08/2016  . Left flank mass 04/30/2016  . Obesity, Class I, BMI 30-34.9 02/06/2016  . Hiatal hernia 02/06/2016  . Vitamin D deficiency 07/12/2015  . Vitamin B12 deficiency 07/12/2015  . IBS (irritable bowel syndrome) 07/11/2015  . Type 2 diabetes mellitus, uncontrolled, with neuropathy ([place]) 07/06/2015  . Hyperlipidemia 07/06/2015  . Hypertension 07/06/2015  . Colon  polyp 07/06/2015  . 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 Presence of intraocular lens 05/24/2015  . Pseudophakia of right eye 05/24/2015  . Arthralgia of hip 07/25/2014  . Combined forms of age-related cataract of both eyes 07/12/2014   This entire session was performed under direct supervision and direction of a licensed therapist/therapist assistant. I have personally read, edited and approve of the note as written.  Pura Spice, PT, DPT # [id number] Janna Arch, SPT 12/24/2016, 5:23 PM  [facility] [address] [place], [place], [address] Phone: [phone]   Fax:  [phone]  Name: Betty Ibarra MRN: [id number] Date of Birth: [birth year]

## 2016-12-23 NOTE — Telephone Encounter (Signed)
error 

## 2016-12-24 ENCOUNTER — Encounter: Payer: Self-pay | Admitting: Physical Therapy

## 2016-12-30 ENCOUNTER — Encounter: Payer: Medicare Other | Admitting: Physical Therapy

## 2016-12-30 ENCOUNTER — Encounter: Payer: Self-pay | Admitting: Physical Therapy

## 2016-12-30 ENCOUNTER — Ambulatory Visit: Payer: Medicare Other | Admitting: Physical Therapy

## 2016-12-30 DIAGNOSIS — M6281 Muscle weakness (generalized): Secondary | ICD-10-CM

## 2016-12-30 DIAGNOSIS — M79605 Pain in left leg: Secondary | ICD-10-CM | POA: Diagnosis not present

## 2016-12-30 DIAGNOSIS — M25672 Stiffness of left ankle, not elsewhere classified: Secondary | ICD-10-CM

## 2016-12-30 DIAGNOSIS — R2689 Other abnormalities of gait and mobility: Secondary | ICD-10-CM | POA: Diagnosis not present

## 2016-12-30 NOTE — Therapy (Signed)
[facility] [address] [place], [place], [address] Phone: [phone]   Fax:  [phone]  Physical Therapy Treatment  Patient Details  Name: Betty Ibarra MRN: [id number] Date of Birth: Jun 11, 1941 Referring Provider: Adline Potter, MD  Encounter Date: 12/30/2016      PT End of Session - 12/30/16 1737    Visit Number 4   Number of Visits 8   Date for PT Re-Evaluation 01/09/17   Authorization - Visit Number 4   Authorization - Number of Visits 10   PT Start Time 1696   PT Stop Time 1602   PT Time Calculation (min) 46 min   Equipment Utilized During Treatment Gait belt   Activity Tolerance Patient tolerated treatment well   Behavior During Therapy [facility] for tasks assessed/performed      Past Medical History:  Diagnosis Date  . Arthritis   . Diabetes mellitus without complication ([place])   . Hyperlipidemia   . Hypertension   . S/P partial hysterectomy     Past Surgical History:  Procedure Laterality Date  . CHOLECYSTECTOMY    . COLONOSCOPY  2010, 2015   one polyp  . PARTIAL HYSTERECTOMY     one ovary remains    There were no vitals filed for this visit.      Subjective Assessment - 12/30/16 1736    Subjective Patient continues to feel better and is having less pain. She is concerned about her abdominal masses and requested core stabilization exercises.    Pertinent History uncontrolled diabetes, neuropathy, stenosis, hiatal hernia   Limitations Standing;Lifting;Walking;House hold activities   How long can you sit comfortably? comfortably   How long can you stand comfortably? not very long   How long can you walk comfortably? about 2 blocks   Patient Stated Goals return to household chores, walk for longer, decrease pain   Currently in Pain? No/denies   Pain Onset More than a month ago     Manual Supine LLE hamstring neural glides with SLR 1x60 seconds Grade I-III mobilizations of L carpals, MTPs, and toes.  Hypomobile initially, pt. Reported pain relief with repetition Ankle mobilizations ( AP, med, lat,) decreased hypomobility with repetition. 20 sec each direction STM plantar aspect of L foot and toes.  DF PROM 4x 20 second holds with stabilization provided at tibia in supine  PROM hamstring 2x 60 seconds, popliteal angle 60 seconds, IR, ER  TherEx TA contraction with Swiss ball in supine TA contraction 10x5 second holds TA contraction marching 10x  Step over 3" step heel tap and return to initial location (step forward and back with single limb support and single UE support)2x10 each leg.  Step over and slowly eccentric lower foot to ground (no audible foot slap) and step back over 3" step. Occasional foot slaps hurt with LLE (3x)  Airex Pad: heel raises 30 sec, toe raises 30 sec Walking in // bars with focus on controlled foot placement to decrease frequency of audible foot slap during IC/LR with cues for utilizing rocker motion.  Ambulating backwards in // bars with cues for larger steps and slower speed Stepping over 4 1" obstacles with controlled foot and no foot slap. 6x. Occasional audible foot slap Moving 10 marbles with toes from one side of towel to another.    Pt. will benefit from skilled physical therapy services to address strength deficits, improve balance, and improve gait mechanics for increased safety while ambulating and improved quality of life  PT Education - 12/30/16 1737    Education provided Yes   Education Details implementation of TA contraction with exercises    Person(s) Educated Patient   Methods Explanation;Demonstration   Comprehension Verbalized understanding;Returned demonstration             PT Long Term Goals - 12/09/16 1827      PT LONG TERM GOAL #1   Title Patient will improve LEFS score to > 40/80 to improve quality of life.    Baseline 3/27: 22/80   Time 4   Period Weeks   Status New     PT LONG TERM GOAL #2   Title Patient will  improve BERG score to 54/56 to increase safe ambulation in the community.    Baseline 3/27: 48   Time 4   Period Weeks   Status New     PT LONG TERM GOAL #3   Title Pt. will be able to achieve active neutral DF with LLE to improve gait mechanics   Baseline AROM: -4   Time 4   Period Weeks   Status New     PT LONG TERM GOAL #4   Title Patient will demonstrate 4/5 strength in LLE to increase functional mobility and safety in community ambulation.    Baseline LLE grossly 3+/5   Time 4   Period Weeks   Status New            Plan - 12/30/16 1740    Clinical Impression Statement Patient is demonstrating improved gait mechanics and decreased pain with exercise. Audible foot slap is still occasionally heard at initial contact/loading response, especially when patient is not focused on task. Mobilization of ankle and foot joints with STM was beneficial to pt. and pt. reported improved mobility. Patient will continue to benefit from eccentric DF strengthening exercises as well as foot intrinsic and hip ROM.  Patient will benefit from continued skilled physical therapy to address strength and mobility deficits, improve balance and gait mechanics for improved safety while ambulating and quality of life.    Rehab Potential Fair   Clinical Impairments Affecting Rehab Potential diabetes, hiatial hernia   PT Frequency 1x / week   PT Duration 4 weeks   PT Treatment/Interventions ADLs/Self Care Home Management;Cryotherapy;Electrical Stimulation;DME Instruction;Ultrasound;Traction;Moist Heat;Iontophoresis 4mg /ml Dexamethasone;Gait training;Stair training;Functional mobility training;Therapeutic activities;Therapeutic exercise;Balance training;Orthotic Fit/Training;Patient/family education;Neuromuscular re-education;Manual techniques;Compression bandaging;Taping;Splinting;Energy conservation;Passive range of motion   PT Next Visit Plan recert?/dc?   PT Home Exercise Plan see sheet   Consulted and  Agree with Plan of Care Patient      Patient will benefit from skilled therapeutic intervention in order to improve the following deficits and impairments:  Abnormal gait, Decreased activity tolerance, Decreased balance, Decreased endurance, Decreased range of motion, Difficulty walking, Decreased strength, Impaired flexibility, Impaired perceived functional ability, Improper body mechanics, Postural dysfunction, Pain  Visit Diagnosis: Muscle weakness (generalized)  Other abnormalities of gait and mobility  Stiffness of left ankle, not elsewhere classified  Pain of left leg     Problem List Patient Active Problem List   Diagnosis Date Noted  . Gait instability 10/08/2016  . Left flank mass 04/30/2016  . Obesity, Class I, BMI 30-34.9 02/06/2016  . Hiatal hernia 02/06/2016  . Vitamin D deficiency 07/12/2015  . Vitamin B12 deficiency 07/12/2015  . IBS (irritable bowel syndrome) 07/11/2015  . Type 2 diabetes mellitus, uncontrolled, with neuropathy ([place]) 07/06/2015  . Hyperlipidemia 07/06/2015  . Hypertension 07/06/2015  . Colon polyp 07/06/2015  . Nonexudative age-related macular degeneration  05/24/2015  . Presence of intraocular lens 05/24/2015  . Pseudophakia of right eye 05/24/2015  . Arthralgia of hip 07/25/2014  . Combined forms of age-related cataract of both eyes 07/12/2014   This entire session was performed under direct supervision and direction of a licensed therapist/therapist assistant. I have personally read, edited and approve of the note as written.  Pura Spice, PT, DPT # [id number] Janna Arch, SPT 12/31/2016, 12:47 PM  [facility] [address] [place], [place], [address] Phone: [phone]   Fax:  [phone]  Name: Briahnna Harries MRN: [id number] Date of Birth: 12/16/1940

## 2017-01-06 ENCOUNTER — Ambulatory Visit: Payer: Medicare Other | Admitting: Family Medicine

## 2017-01-06 ENCOUNTER — Encounter: Payer: Medicare Other | Admitting: Physical Therapy

## 2017-01-07 ENCOUNTER — Ambulatory Visit: Payer: Medicare Other | Admitting: Physical Therapy

## 2017-01-07 ENCOUNTER — Ambulatory Visit
Admission: RE | Admit: 2017-01-07 | Discharge: 2017-01-07 | Disposition: A | Discharge status: Home / Self Care | Payer: Medicare Other | Source: Ambulatory Visit | Attending: Family Medicine | Admitting: Family Medicine

## 2017-01-07 ENCOUNTER — Telehealth: Payer: Self-pay

## 2017-01-07 ENCOUNTER — Encounter: Payer: Self-pay | Admitting: Family Medicine

## 2017-01-07 ENCOUNTER — Ambulatory Visit (INDEPENDENT_AMBULATORY_CARE_PROVIDER_SITE_OTHER): Payer: Medicare Other | Admitting: Family Medicine

## 2017-01-07 VITALS — BP 122/78 | HR 68 | Resp 16 | Ht 63.0 in | Wt 166.2 lb

## 2017-01-07 DIAGNOSIS — E1165 Type 2 diabetes mellitus with hyperglycemia: Secondary | ICD-10-CM | POA: Diagnosis not present

## 2017-01-07 DIAGNOSIS — E559 Vitamin D deficiency, unspecified: Secondary | ICD-10-CM

## 2017-01-07 DIAGNOSIS — I10 Essential (primary) hypertension: Secondary | ICD-10-CM | POA: Diagnosis not present

## 2017-01-07 DIAGNOSIS — R1010 Upper abdominal pain, unspecified: Secondary | ICD-10-CM | POA: Diagnosis not present

## 2017-01-07 DIAGNOSIS — R2681 Unsteadiness on feet: Secondary | ICD-10-CM | POA: Diagnosis not present

## 2017-01-07 DIAGNOSIS — N993 Prolapse of vaginal vault after hysterectomy: Secondary | ICD-10-CM | POA: Diagnosis not present

## 2017-01-07 DIAGNOSIS — M25672 Stiffness of left ankle, not elsewhere classified: Secondary | ICD-10-CM

## 2017-01-07 DIAGNOSIS — Z9049 Acquired absence of other specified parts of digestive tract: Secondary | ICD-10-CM | POA: Insufficient documentation

## 2017-01-07 DIAGNOSIS — M79605 Pain in left leg: Secondary | ICD-10-CM | POA: Diagnosis not present

## 2017-01-07 DIAGNOSIS — R2689 Other abnormalities of gait and mobility: Secondary | ICD-10-CM | POA: Diagnosis not present

## 2017-01-07 DIAGNOSIS — E1140 Type 2 diabetes mellitus with diabetic neuropathy, unspecified: Secondary | ICD-10-CM

## 2017-01-07 DIAGNOSIS — IMO0002 Reserved for concepts with insufficient information to code with codable children: Secondary | ICD-10-CM

## 2017-01-07 DIAGNOSIS — M6281 Muscle weakness (generalized): Secondary | ICD-10-CM | POA: Diagnosis not present

## 2017-01-07 DIAGNOSIS — Q638 Other specified congenital malformations of kidney: Secondary | ICD-10-CM | POA: Insufficient documentation

## 2017-01-07 DIAGNOSIS — K5730 Diverticulosis of large intestine without perforation or abscess without bleeding: Secondary | ICD-10-CM | POA: Insufficient documentation

## 2017-01-07 DIAGNOSIS — E785 Hyperlipidemia, unspecified: Secondary | ICD-10-CM

## 2017-01-07 DIAGNOSIS — I700 Atherosclerosis of aorta: Secondary | ICD-10-CM | POA: Diagnosis not present

## 2017-01-07 DIAGNOSIS — E538 Deficiency of other specified B group vitamins: Secondary | ICD-10-CM

## 2017-01-07 MED ORDER — IOPAMIDOL (ISOVUE-300) INJECTION 61%
100.0000 mL | Freq: Once | INTRAVENOUS | Status: AC | PRN
  Administered 2017-01-07: 100 mL via INTRAVENOUS

## 2017-01-07 NOTE — Progress Notes (Signed)
Date:  01/07/2017   Name:  Betty Ibarra   DOB:  07-25-1941   MRN:  [id number]  PCP:  Adline Potter, MD    Chief Complaint: Diabetes   History of Present Illness:  This is a 76 y.o. female seen for three week f/u. Reports persistent RUQ abdominal pain radiating to epigastrium, not affected by eating. CT done at [facility] in October showed small R anterior wall hernia and pancreatic tail atrophy/ductal dilation. Had f/u MRI done through ortho not in system. T@DM  has been uncontrolled, last a1c 12.4% in Jan, on metformin and glipizide bid but often forgets PM dose, interested in once daily meds if possible, wants to avoid injectables. Also not keen on statin as told can make OA sxs worse. Has restarted B12 at lower dose.  Review of Systems:  Review of Systems  Constitutional: Negative for chills and fever.  Respiratory: Negative for cough and shortness of breath.   Cardiovascular: Negative for chest pain and leg swelling.  Gastrointestinal: Negative for constipation and diarrhea.  Endocrine: Negative for polydipsia and polyuria.  Genitourinary: Negative for difficulty urinating.  Neurological: Negative for syncope and light-headedness.    Patient Active Problem List   Diagnosis Date Noted  . Gait instability 10/08/2016  . Left flank mass 04/30/2016  . Obesity, Class I, BMI 30-34.9 02/06/2016  . Hiatal hernia 02/06/2016  . Vitamin D deficiency 07/12/2015  . Vitamin B12 deficiency 07/12/2015  . IBS (irritable bowel syndrome) 07/11/2015  . Type 2 diabetes mellitus, uncontrolled, with neuropathy ([place]) 07/06/2015  . Hyperlipidemia 07/06/2015  . Hypertension 07/06/2015  . Colon polyp 07/06/2015  . 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 Presence of intraocular lens 05/24/2015  . Pseudophakia of right eye 05/24/2015  . Arthralgia of hip 07/25/2014  . Combined forms of age-related cataract of both eyes 07/12/2014    Prior to Admission medications    Medication Sig Start Date End Date Taking? Authorizing Provider  baclofen (LIORESAL) 10 MG tablet Take 10 mg by mouth 3 (three) times daily as needed.   Yes Historical Provider, MD  Cholecalciferol (VITAMIN D3) 2000 units TABS Take 2 tablets by mouth daily.   Yes Historical Provider, MD  glipiZIDE (GLUCOTROL) 5 MG tablet Take 1 tablet (5 mg total) by mouth 2 (two) times daily before a meal. 02/21/16  Yes Adline Potter, MD  glucose blood test strip Use as instructed 11/07/15  Yes Adline Potter, MD  Lancets [facility] ULTRASOFT) lancets Use as instructed 11/07/15  Yes Adline Potter, MD  lisinopril (PRINIVIL,ZESTRIL) 20 MG tablet Take 1 tablet (20 mg total) by mouth daily. 10/09/16  Yes Adline Potter, MD  metFORMIN (GLUCOPHAGE) 1000 MG tablet Take 1 tablet (1,000 mg total) by mouth 2 (two) times daily with a meal. 03/04/16  Yes Adline Potter, MD  vitamin B-12 (CYANOCOBALAMIN) 1000 MCG tablet Take 0.5 tablets (500 mcg total) by mouth daily. 10/09/16  Yes Adline Potter, MD    Allergies  Allergen Reactions  . Sulfa Antibiotics Itching and Rash  . Penicillins Rash    Past Surgical History:  Procedure Laterality Date  . CHOLECYSTECTOMY    . COLONOSCOPY  2010, 2015   one polyp  . PARTIAL HYSTERECTOMY     one ovary remains    Social History  Substance Use Topics  . Smoking status: Never Smoker  . Smokeless tobacco: Never Used  . Alcohol use No     Comment: glass wine every 3-6 months    Family History  Problem Relation Age of  Onset  . Colon cancer Mother   . Diabetes Father   . Diabetes Maternal Grandmother   . Diabetes Paternal Grandmother     Medication list has been reviewed and updated.  Physical Examination: BP 122/78   Pulse 68   Resp 16   Ht 5\' 3"  (1.6 m)   Wt 166 lb 3.2 oz (75.4 kg)   SpO2 98%   BMI 29.44 kg/m   Physical Exam  Constitutional: She appears well-developed and well-nourished.  Cardiovascular: Normal rate, regular rhythm and normal heart sounds.    Pulmonary/Chest: Effort normal and breath sounds normal.  Neurological: She is alert.  Skin: Skin is warm and dry.  Psychiatric: She has a normal mood and affect. Her behavior is normal.  Nursing note and vitals reviewed.   Assessment and Plan:  1. Type 2 diabetes mellitus, uncontrolled, with neuropathy (HCC) Consider switch to qd med dosing, addition of Januvia or Actos or insulin if control unimproved - HgB A1c  2. Pain of upper abdomen F/u abnormal CT in October - CT Abdomen Pelvis Wo Contrast; Future  3. Essential hypertension Well controlled on lisinopril  4. Hyperlipidemia, unspecified hyperlipidemia type Reluctant to start statin, will hold for now  5. Vitamin B12 deficiency On decreased dose B12, consider repeat level next visit  6. Vitamin D deficiency Well controlled on supplement  7. Gait instability Seeing PT, helping  Return in about 3 months (around 04/08/2017).  Satira Anis. [facility]  01/07/2017

## 2017-01-07 NOTE — Addendum Note (Signed)
Addended by: Theresia Majors A on: 01/07/2017 12:17 PM   Modules accepted: Orders

## 2017-01-08 ENCOUNTER — Other Ambulatory Visit: Payer: Self-pay | Admitting: Family Medicine

## 2017-01-08 LAB — HEMOGLOBIN A1C
Est. average glucose Bld gHb Est-mCnc: 226 mg/dL
HEMOGLOBIN A1C: 9.5 % — AB (ref 4.8–5.6)

## 2017-01-08 MED ORDER — ATORVASTATIN CALCIUM 40 MG PO TABS: 40.0000 mg | ORAL_TABLET | Freq: Every day | ORAL | 2 refills | Status: DC

## 2017-01-08 MED ORDER — GLIPIZIDE ER 10 MG PO TB24: 20.0000 mg | ORAL_TABLET | Freq: Every day | ORAL | 2 refills | Status: DC

## 2017-01-08 MED ORDER — ASPIRIN 81 MG PO TABS: 81.0000 mg | ORAL_TABLET | Freq: Every day | ORAL | Status: DC

## 2017-01-08 MED ORDER — METFORMIN HCL ER (MOD) 1000 MG PO TB24: 2000.0000 mg | ORAL_TABLET | Freq: Every day | ORAL | 2 refills | Status: DC

## 2017-01-08 NOTE — Therapy (Addendum)
[facility] [address] [place], [place], [address] Phone: [phone]   Fax:  [phone]  Physical Therapy Treatment  Patient Details  Name: Betty Ibarra MRN: [id number] Date of Birth: 03-Nov-1940 Referring Provider: Adline Potter, MD  Encounter Date: 01/07/2017        PT End of Session - 01/08/17 1243    Visit Number 5   Number of Visits 8   Date for PT Re-Evaluation 01/09/17   Authorization - Visit Number 5   Authorization - Number of Visits 10   PT Start Time 1301   PT Stop Time 0737   PT Time Calculation (min) 53 min   Equipment Utilized During Treatment Gait belt   Activity Tolerance Patient tolerated treatment well   Behavior During Therapy [facility] for tasks assessed/performed       Past Medical History:  Diagnosis Date  . Arthritis   . Diabetes mellitus without complication ([place])   . Hyperlipidemia   . Hypertension   . S/P partial hysterectomy     Past Surgical History:  Procedure Laterality Date  . CHOLECYSTECTOMY    . COLONOSCOPY  2010, 2015   one polyp  . PARTIAL HYSTERECTOMY     one ovary remains    There were no vitals filed for this visit.    Objective:  There.ex.: Reviewed HEP in depth.   Progression of TrA ex. In supine position (see handouts).  Neuro:  Merrilee Jansky 53/56.  Manual tx.:  Supine LE stretches (as tolerated).  Focus on hip/piriformis/hamstring flexibility.  L ankle mobs./ DF stretches.  Pt. Will continue with HEP on an independent basis for next 1-2 weeks and contact PT to determine POC/ possible discharge.     Pt response for medical necessity:  Benefits from LE strengthening/ balance tasks to improve pain-free mobility.  Marked improvement noted over past several weeks.           PT Long Term Goals - 01/08/17 0917      PT LONG TERM GOAL #1   Title Patient will improve LEFS score to > 40/80 to improve quality of life.    Baseline 3/27: 22/80   Time 4   Period Weeks   Status  On-going     PT LONG TERM GOAL #2   Title Patient will improve BERG score to 54/56 to increase safe ambulation in the community.    Baseline 4/25: Berg 53/56.     Time 4   Period Weeks   Status Partially Met     PT LONG TERM GOAL #3   Title Pt. will be able to achieve active neutral DF with LLE to improve gait mechanics   Baseline AROM: 0 deg.   Time 4   Period Weeks   Status Achieved     PT LONG TERM GOAL #4   Title Patient will demonstrate 4/5 strength in LLE to increase functional mobility and safety in community ambulation.    Baseline B LE muscle strength grossly 4/5.  Tenderness in L hip/LE   Time 4   Period Weeks   Status Partially Met       Patient will benefit from skilled therapeutic intervention in order to improve the following deficits and impairments:  Abnormal gait, Decreased activity tolerance, Decreased balance, Decreased endurance, Decreased range of motion, Difficulty walking, Decreased strength, Impaired flexibility, Impaired perceived functional ability, Improper body mechanics, Postural dysfunction, Pain  Visit Diagnosis: Muscle weakness (generalized)  Other abnormalities of gait and mobility  Stiffness  of left ankle, not elsewhere classified  Pain of left leg     Problem List Patient Active Problem List   Diagnosis Date Noted  . Gait instability 10/08/2016  . Left flank mass 04/30/2016  . Obesity, Class I, BMI 30-34.9 02/06/2016  . Hiatal hernia 02/06/2016  . Vitamin D deficiency 07/12/2015  . Vitamin B12 deficiency 07/12/2015  . IBS (irritable bowel syndrome) 07/11/2015  . Type 2 diabetes mellitus, uncontrolled, with neuropathy ([place]) 07/06/2015  . Hyperlipidemia 07/06/2015  . Hypertension 07/06/2015  . Colon polyp 07/06/2015  . 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 Presence of intraocular lens 05/24/2015  . Pseudophakia of right eye 05/24/2015  . Arthralgia of hip 07/25/2014  . Combined forms of age-related  cataract of both eyes 07/12/2014   Pura Spice, PT, DPT # [id number] 01/08/2017 9:22 AM  [facility] [address] [place], [place], [address] Phone: [phone]   Fax:  [phone]  Name: Betty Ibarra MRN: [id number] Date of Birth: 08/14/1941

## 2017-01-08 NOTE — Progress Notes (Signed)
gipizide

## 2017-01-09 NOTE — Telephone Encounter (Signed)
Discussed labs

## 2017-01-23 ENCOUNTER — Other Ambulatory Visit: Payer: Self-pay | Admitting: Family Medicine

## 2017-01-23 ENCOUNTER — Telehealth: Payer: Self-pay

## 2017-01-23 DIAGNOSIS — I10 Essential (primary) hypertension: Secondary | ICD-10-CM

## 2017-01-23 MED ORDER — METFORMIN HCL 1000 MG PO TABS: 1000.0000 mg | ORAL_TABLET | Freq: Two times a day (BID) | ORAL | 3 refills | Status: DC

## 2017-01-23 MED ORDER — LISINOPRIL 20 MG PO TABS: 20.0000 mg | ORAL_TABLET | Freq: Every day | ORAL | 3 refills | Status: DC

## 2017-01-23 NOTE — Telephone Encounter (Signed)
Patient can not afford new Metformin 7,000 bucks. She wants to go back to the one she was ion. She also wants to have Lisinopril 20 filled. She said [facility] sent refill paper several times this week but I have nothing. Advised will send in refills unless you say no and then I will call her. She uses [facility] and also wants 90 days of both.

## 2017-01-23 NOTE — Telephone Encounter (Signed)
Rxs for metformin and lisinopril sent.

## 2017-01-27 ENCOUNTER — Ambulatory Visit: Payer: Medicare Other | Attending: Family Medicine | Admitting: Physical Therapy

## 2017-01-27 DIAGNOSIS — M79605 Pain in left leg: Secondary | ICD-10-CM | POA: Diagnosis not present

## 2017-01-27 DIAGNOSIS — M6281 Muscle weakness (generalized): Secondary | ICD-10-CM | POA: Diagnosis not present

## 2017-01-27 DIAGNOSIS — R2689 Other abnormalities of gait and mobility: Secondary | ICD-10-CM

## 2017-01-27 DIAGNOSIS — M25672 Stiffness of left ankle, not elsewhere classified: Secondary | ICD-10-CM | POA: Diagnosis not present

## 2017-01-27 NOTE — Therapy (Signed)
[facility] [address] [place], [place], [address] Phone: [phone]   Fax:  [phone]  Physical Therapy Treatment  Patient Details  Name: Betty Ibarra MRN: [id number] Date of Birth: 14-Sep-1941 Referring Provider: Adline Potter, MD  Encounter Date: 01/27/2017      PT End of Session - 01/28/17 0926    Visit Number 6   Number of Visits 13   Date for PT Re-Evaluation 02/24/17   Authorization - Visit Number 6   Authorization - Number of Visits 15   PT Start Time 1251   PT Stop Time 6256   PT Time Calculation (min) 62 min   Equipment Utilized During Treatment Gait belt   Activity Tolerance Patient tolerated treatment well;Patient limited by fatigue   Behavior During Therapy [facility] for tasks assessed/performed      Past Medical History:  Diagnosis Date  . Arthritis   . Diabetes mellitus without complication ([place])   . Hyperlipidemia   . Hypertension   . S/P partial hysterectomy     Past Surgical History:  Procedure Laterality Date  . CHOLECYSTECTOMY    . COLONOSCOPY  2010, 2015   one polyp  . PARTIAL HYSTERECTOMY     one ovary remains    There were no vitals filed for this visit.      Subjective Assessment - 01/28/17 0923    Subjective Pt. called last week and reported a sharp increase in L LE pain.  Pt. states she has been unable to get out of bed for about 5 days.  Pt. arrived to PT today with use of RW and moderate L antalgic gait pattern.  Pt. reports no recent falls but increase L LE muscle pain/ weakness.  Pt. states she is feeling better today but the past 2 weeks has been difficult.     Pertinent History uncontrolled diabetes, neuropathy, stenosis, hiatal hernia   Limitations Standing;Lifting;Walking;House hold activities   How long can you sit comfortably? comfortably   How long can you stand comfortably? not very long   How long can you walk comfortably? about 2 blocks   Patient Stated Goals return to  household chores, walk for longer, decrease pain   Currently in Pain? Yes   Pain Score 2    Pain Location Leg   Pain Orientation Left      Manual Supine LE/lumbar generalized stretches (7 min.) LLE hamstring neural glides with SLR 3x60 seconds Ankle mobilizations ( AP, med, lat,) decreased hypomobility with repetition. 20 sec each direction DF PROM 4x 20second holds with stabilization provided at tibia in supine  Reassessed hamstring flexibility 2x 60 seconds, popliteal angle 60 seconds, IR, ER  Neuro:  tandem stance/gait (difficulty with L LE posterior to R) Step ups/downs Sit to stands/ wt. Shift correction (compensatory lean to R). Posture reassessment with mirror feedback.   TherEx Walking in // bars with focus on controlled foot placement to decrease frequency of audible foot slap during IC/LR with cues for utilizing rocker motion.  Supine Core ex. Program (see handouts)- page #1-2.  Supine trunk rotn. 10x/ standing heel raises.      Pt. will benefit from skilled physical therapy services to address strength deficits, improve balance, and improve gait mechanics for increased safety while ambulating and improved quality of life       PT Long Term Goals - 01/28/17 0944      PT LONG TERM GOAL #1   Title Patient will improve LEFS score to > 40/80  to improve quality of life.    Baseline 3/27: 22/80   Time 4   Period Weeks   Status On-going     PT LONG TERM GOAL #2   Title Patient will improve BERG score to 54/56 to increase safe ambulation in the community.    Baseline 4/25: Berg 53/56.     Time 4   Period Weeks   Status Partially Met     PT LONG TERM GOAL #3   Title Pt. will be able to achieve active neutral DF with LLE to improve gait mechanics   Baseline AROM: 0 deg.   Time 4   Period Weeks   Status Achieved     PT LONG TERM GOAL #4   Title Patient will demonstrate 4/5 strength in LLE to increase functional mobility and safety in community ambulation.     Baseline B LE muscle strength grossly 4/5.  Tenderness in L hip/LE   Time 4   Period Weeks   Status Partially Met     PT LONG TERM GOAL #5   Title Pt. able to ambulate 10 minutes with normalized gait pattern and no assistive device on level surfaces safely.     Baseline pt. currently using RW to ambulate short household distances secondary to increase L LE pain.      Time 4   Period Weeks   Status New           Plan - 01/28/17 6378    Clinical Impression Statement Pt. presents with L hip/LE muscle pain this morning.  Pt. states her pain has been much better today as compared to previous 2 weeks.  Pt. using RW to deweight L LE with walking at this time.  Improved gait pattern/ cadence/ posture while using RW as compared to no assistive device.  Moderate L LE muscle weakness grossly 4-/5 MMT.  Difficulty with tandem stance while L LE posterior to R.  Pt. really guarded with L LE weight bearing during transfers/ walking in clinic.  PT reviewed HEP and pt. will return to doing LE stretches/lumbar stretches/ core stability and LE strengthening ex. to promote pain-free mobility.      Rehab Potential Fair   Clinical Impairments Affecting Rehab Potential diabetes, hiatial hernia   PT Frequency 1x / week   PT Duration 4 weeks   PT Treatment/Interventions ADLs/Self Care Home Management;Cryotherapy;Electrical Stimulation;DME Instruction;Ultrasound;Traction;Moist Heat;Iontophoresis 10m/ml Dexamethasone;Gait training;Stair training;Functional mobility training;Therapeutic activities;Therapeutic exercise;Balance training;Orthotic Fit/Training;Patient/family education;Neuromuscular re-education;Manual techniques;Compression bandaging;Taping;Splinting;Energy conservation;Passive range of motion   PT Next Visit Plan Pt. will continue PT 1-2x/week for next several weeks.     PT Home Exercise Plan see sheet   Consulted and Agree with Plan of Care Patient      Patient will benefit from skilled therapeutic  intervention in order to improve the following deficits and impairments:  Abnormal gait, Decreased activity tolerance, Decreased balance, Decreased endurance, Decreased range of motion, Difficulty walking, Decreased strength, Impaired flexibility, Impaired perceived functional ability, Improper body mechanics, Postural dysfunction, Pain  Visit Diagnosis: Muscle weakness (generalized)  Other abnormalities of gait and mobility  Stiffness of left ankle, not elsewhere classified  Pain of left leg       G-Codes - 006/01/181543    Functional Assessment Tool Used (Outpatient Only) Clinical impression/ gait difficulty/ muscle weakness/ balance issues/ LEFS   Functional Limitation Mobility: Walking and moving around   Mobility: Walking and Moving Around Current Status ((H8850 At least 20 percent but less than 40 percent impaired, limited or  restricted   Mobility: Walking and Moving Around Goal Status [phone]) At least 1 percent but less than 20 percent impaired, limited or restricted      Problem List Patient Active Problem List   Diagnosis Date Noted  . Gait instability 10/08/2016  . Left flank mass 04/30/2016  . Obesity, Class I, BMI 30-34.9 02/06/2016  . Hiatal hernia 02/06/2016  . Vitamin D deficiency 07/12/2015  . Vitamin B12 deficiency 07/12/2015  . IBS (irritable bowel syndrome) 07/11/2015  . Type 2 diabetes mellitus, uncontrolled, with neuropathy ([place]) 07/06/2015  . Hyperlipidemia 07/06/2015  . Hypertension 07/06/2015  . Colon polyp 07/06/2015  . 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 Presence of intraocular lens 05/24/2015  . Pseudophakia of right eye 05/24/2015  . Arthralgia of hip 07/25/2014  . Combined forms of age-related cataract of both eyes 07/12/2014   Pura Spice, PT, DPT # [phone] 01/28/2017, 9:46 AM  [facility] [address] [place], [place], [address] Phone: [phone]   Fax:   [phone]  Name: Betty Ibarra MRN: [id number] Date of Birth: 1941-05-21

## 2017-01-28 ENCOUNTER — Encounter: Payer: Self-pay | Admitting: Physical Therapy

## 2017-02-03 ENCOUNTER — Ambulatory Visit: Payer: Medicare Other | Admitting: Physical Therapy

## 2017-02-03 DIAGNOSIS — M25672 Stiffness of left ankle, not elsewhere classified: Secondary | ICD-10-CM | POA: Diagnosis not present

## 2017-02-03 DIAGNOSIS — R2689 Other abnormalities of gait and mobility: Secondary | ICD-10-CM

## 2017-02-03 DIAGNOSIS — M79605 Pain in left leg: Secondary | ICD-10-CM | POA: Diagnosis not present

## 2017-02-03 DIAGNOSIS — M6281 Muscle weakness (generalized): Secondary | ICD-10-CM | POA: Diagnosis not present

## 2017-02-03 NOTE — Therapy (Signed)
[place] [facility] [address] [place], [place], [address] Phone: [phone]   Fax:  [phone]  Physical Therapy Treatment  Patient Details  Name: Betty Ibarra MRN: [id number] Date of Birth: 1940/10/02 Referring Provider: Adline Potter, MD  Encounter Date: 02/03/2017      PT End of Session - 02/04/17 1811    Visit Number 7   Number of Visits 13   Date for PT Re-Evaluation 02/24/17   Authorization - Visit Number 7   Authorization - Number of Visits 15   PT Start Time 0932   PT Stop Time 1412   PT Time Calculation (min) 55 min   Equipment Utilized During Treatment Gait belt   Activity Tolerance Patient tolerated treatment well;Patient limited by fatigue   Behavior During Therapy [facility] for tasks assessed/performed      Past Medical History:  Diagnosis Date  . Arthritis   . Diabetes mellitus without complication ([place])   . Hyperlipidemia   . Hypertension   . S/P partial hysterectomy     Past Surgical History:  Procedure Laterality Date  . CHOLECYSTECTOMY    . COLONOSCOPY  2010, 2015   one polyp  . PARTIAL HYSTERECTOMY     one ovary remains    There were no vitals filed for this visit.      Subjective Assessment - 02/04/17 1810    Subjective Pt. states exercise makes her hurt and sore.  Pt. reports being fairly inactive at home and hasn't completed HEP.  Pt. entered PT with use of SPC, not RW today.     Pertinent History uncontrolled diabetes, neuropathy, stenosis, hiatal hernia   Limitations Standing;Lifting;Walking;House hold activities   How long can you sit comfortably? comfortably   How long can you stand comfortably? not very long   How long can you walk comfortably? about 2 blocks   Patient Stated Goals return to household chores, walk for longer, decrease pain   Currently in Pain? Yes   Pain Score 2    Pain Location Leg   Pain Orientation Left   Pain Descriptors / Indicators Aching     Nustep L3 Betty  UE/LE (warm-up/no charge).     Neuro:  Tandem stance/gait (difficulty with L LE posterior to R) Picking up ball in //-bars.  Turning CW/CCW. Sit to stands/ wt. Shift correction (compensatory lean to R). Posture correction with mirror while walking.     TherEx Supine LE/lumbar general stretches (7 min.)- L hip pinching with gentle knee to chest.   Supine Core ex. Program: marching/ SLR/ hip abd./ add. With ball 20x each (as tolerated)- frequent cuing for core muscle activation/ proper breathing. Supine knee to chest with ball/ rotn. With ball 10x.  Standing hip ex. (all planes).       Pt. will benefit from skilled physical therapy services to address strength deficits, improve balance, and improve gait mechanics for increased safety while ambulating and improved quality of life        PT Long Term Goals - 01/28/17 0944      PT LONG TERM GOAL #1   Title Patient will improve LEFS score to > 40/80 to improve quality of life.    Baseline 3/27: 22/80   Time 4   Period Weeks   Status On-going     PT LONG TERM GOAL #2   Title Patient will improve BERG score to 54/56 to increase safe ambulation in the community.    Baseline 4/25: Berg 53/56.  Time 4   Period Weeks   Status Partially Met     PT LONG TERM GOAL #3   Title Pt. will be able to achieve active neutral DF with LLE to improve gait mechanics   Baseline AROM: 0 deg.   Time 4   Period Weeks   Status Achieved     PT LONG TERM GOAL #4   Title Patient will demonstrate 4/5 strength in LLE to increase functional mobility and safety in community ambulation.    Baseline Betty LE muscle strength grossly 4/5.  Tenderness in L hip/LE   Time 4   Period Weeks   Status Partially Met     PT LONG TERM GOAL #5   Title Pt. able to ambulate 10 minutes with normalized gait pattern and no assistive device on level surfaces safely.     Baseline pt. currently using RW to ambulate short household distances secondary to increase L LE  pain.      Time 4   Period Weeks   Status New            Plan - 02/04/17 1812    Clinical Impression Statement Pt. easily fatigued with supine ther.ex./ balance tasks.  Pt. ambulates with hesitant gait pattern with use of SPC in clinic today.  Several seated rest breaks due to LE muscle fatigue.  Decrease L antalgic gait pattern as compared to previous tx. with use of RW.  PT really encouraged pt. to participate with exercise or walking program on a daily basis.     Rehab Potential Fair   Clinical Impairments Affecting Rehab Potential diabetes, hiatial hernia   PT Frequency 1x / week   PT Duration 4 weeks   PT Treatment/Interventions ADLs/Self Care Home Management;Cryotherapy;Electrical Stimulation;DME Instruction;Ultrasound;Traction;Moist Heat;Iontophoresis 43m/ml Dexamethasone;Gait training;Stair training;Functional mobility training;Therapeutic activities;Therapeutic exercise;Balance training;Orthotic Fit/Training;Patient/family education;Neuromuscular re-education;Manual techniques;Compression bandaging;Taping;Splinting;Energy conservation;Passive range of motion   PT Next Visit Plan Pt. will continue PT 1-2x/week for next several weeks.  Progress HEP/ walking   PT Home Exercise Plan see sheet   Consulted and Agree with Plan of Care Patient      Patient will benefit from skilled therapeutic intervention in order to improve the following deficits and impairments:  Abnormal gait, Decreased activity tolerance, Decreased balance, Decreased endurance, Decreased range of motion, Difficulty walking, Decreased strength, Impaired flexibility, Impaired perceived functional ability, Improper body mechanics, Postural dysfunction, Pain  Visit Diagnosis: Muscle weakness (generalized)  Other abnormalities of gait and mobility  Stiffness of left ankle, not elsewhere classified  Pain of left leg     Problem List Patient Active Problem List   Diagnosis Date Noted  . Gait instability  10/08/2016  . Left flank mass 04/30/2016  . Obesity, Class I, BMI 30-34.9 02/06/2016  . Hiatal hernia 02/06/2016  . Vitamin D deficiency 07/12/2015  . Vitamin B12 deficiency 07/12/2015  . IBS (irritable bowel syndrome) 07/11/2015  . Type 2 diabetes mellitus, uncontrolled, with neuropathy ([place] 07/06/2015  . Hyperlipidemia 07/06/2015  . Hypertension 07/06/2015  . Colon polyp 07/06/2015  . 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 Presence of intraocular lens 05/24/2015  . Pseudophakia of right eye 05/24/2015  . Arthralgia of hip 07/25/2014  . Combined forms of age-related cataract of both eyes 07/12/2014   MPura Spice PT, DPT # [id number]/23/2018, 6:15 PM  [place] [facility]ang[address] [place] [address]Phone: [phone]  Fax:  [phone] Name: BDenea CheaneyMRN: [id number] of Birth:  04-Jul-1941

## 2017-02-10 ENCOUNTER — Ambulatory Visit: Payer: Medicare Other | Admitting: Physical Therapy

## 2017-02-10 ENCOUNTER — Encounter: Payer: Self-pay | Admitting: Physical Therapy

## 2017-02-10 DIAGNOSIS — M25672 Stiffness of left ankle, not elsewhere classified: Secondary | ICD-10-CM

## 2017-02-10 DIAGNOSIS — M79605 Pain in left leg: Secondary | ICD-10-CM | POA: Diagnosis not present

## 2017-02-10 DIAGNOSIS — M6281 Muscle weakness (generalized): Secondary | ICD-10-CM | POA: Diagnosis not present

## 2017-02-10 DIAGNOSIS — R2689 Other abnormalities of gait and mobility: Secondary | ICD-10-CM

## 2017-02-11 NOTE — Therapy (Signed)
[place] [facility] [address] [place], [place], [address] Phone: [phone]   Fax:  [phone]  Physical Therapy Treatment  Patient Details  Name: Betty Ibarra MRN: [id number] Date of Birth: Jun 21, 1941 Referring Provider: Adline Potter, MD  Encounter Date: 02/10/2017      PT End of Session - 02/11/17 1726    Visit Number 8   Number of Visits 13   Date for PT Re-Evaluation 02/24/17   Authorization - Visit Number 8   Authorization - Number of Visits 15   PT Start Time 6599   PT Stop Time 3570   PT Time Calculation (min) 52 min   Equipment Utilized During Treatment Gait belt   Activity Tolerance Patient tolerated treatment well;Patient limited by fatigue   Behavior During Therapy [facility] for tasks assessed/performed      Past Medical History:  Diagnosis Date  . Arthritis   . Diabetes mellitus without complication ([place])   . Hyperlipidemia   . Hypertension   . S/P partial hysterectomy     Past Surgical History:  Procedure Laterality Date  . CHOLECYSTECTOMY    . COLONOSCOPY  2010, 2015   one polyp  . PARTIAL HYSTERECTOMY     one ovary remains    There were no vitals filed for this visit.      Subjective Assessment - 02/10/17 1344    Subjective Pt. states she is not feeling great.  Pt. remains in bed or chair most of the day.  Pt. has friends coming to house to help with cleaning/ household tasks.  Pt. reports 2-3/10 L hip pain and stiffness with daily tasks.  Pt. hesitant to complete HEP due to pain/ fear of pain.    Pertinent History uncontrolled diabetes, neuropathy, stenosis, hiatal hernia   Limitations Standing;Lifting;Walking;House hold activities   How long can you sit comfortably? comfortably   How long can you stand comfortably? not very long   How long can you walk comfortably? about 2 blocks   Patient Stated Goals return to household chores, walk for longer, decrease pain   Currently in Pain? Yes   Pain  Score 3    Pain Location Leg   Pain Orientation Left   Pain Descriptors / Indicators Aching   Pain Type Neuropathic pain        TherEx Nustep L3 B UE/LE (warm-up/no charge). Supine Core ex. Program: marching/ SLR/ hip abd./ add. With ball 20x each (as tolerated)- frequent cuing for core muscle activation/ proper breathing. Supine knee to chest with ball/ rotn. With ball 10x.  Reviewed HEP in depth and encouraged pt. To be more compliant with ex./walking program.    Manual tx: Supine LE/lumbar stretches (17 min.)- pain tolerable with L hip focus L hip LAD in supine position (as tolerated)- 5x with gentle oscillations at end-range L hip mobs (grade III) with belt in supine L hip flexion at 90 deg. (pt. Reports marked decrease in L hip pain).    Pt. will benefit from skilled physical therapy services to address strength deficits, improve balance, and improve gait mechanics for increased safety while ambulating and improved quality of life       PT Long Term Goals - 02/11/17 1730      PT LONG TERM GOAL #1   Title Patient will improve LEFS score to > 40/80 to improve quality of life.    Baseline 3/27: 22/80.  5/29: 23/80  (minimal change)   Time 4   Period Weeks  Status Not Met     PT LONG TERM GOAL #2   Title Patient will improve BERG score to 54/56 to increase safe ambulation in the community.    Baseline 4/25: Berg 53/56.     Time 4   Period Weeks   Status Partially Met     PT LONG TERM GOAL #3   Title Pt. will be able to achieve active neutral DF with LLE to improve gait mechanics   Baseline AROM: 0 deg.   Time 4   Period Weeks   Status Achieved     PT LONG TERM GOAL #4   Title Patient will demonstrate 4/5 strength in LLE to increase functional mobility and safety in community ambulation.    Baseline B LE muscle strength grossly 4/5.  Tenderness in L hip/LE   Time 4   Period Weeks   Status Partially Met     PT LONG TERM GOAL #5   Title Pt. able to  ambulate 10 minutes with normalized gait pattern and no assistive device on level surfaces safely.     Baseline pt. currently using RW or SPC to ambulate short household distances secondary to increase L LE pain.      Time 4   Period Weeks   Status Not Met               Plan - 02/11/17 1726    Clinical Impression Statement Pt. reports marked improvement in overall L hip pain/ stiffness after hip mobs.  Pt. remains limited with compliance of HEP and progression of any home ther.ex. due to pain/ fear of pain/ inactive lifestyle.  PT encouraged pt. to work hard with a daily exercise/walking program to improve pain-free mobility.  Pt. will contact PT if a week of 2 if having any further issues.     Clinical Presentation Stable   Clinical Decision Making Moderate   Rehab Potential Fair   Clinical Impairments Affecting Rehab Potential diabetes, hiatial hernia   PT Frequency 1x / week   PT Duration 4 weeks   PT Treatment/Interventions ADLs/Self Care Home Management;Cryotherapy;Electrical Stimulation;DME Instruction;Ultrasound;Traction;Moist Heat;Iontophoresis 69m/ml Dexamethasone;Gait training;Stair training;Functional mobility training;Therapeutic activities;Therapeutic exercise;Balance training;Orthotic Fit/Training;Patient/family education;Neuromuscular re-education;Manual techniques;Compression bandaging;Taping;Splinting;Energy conservation;Passive range of motion   PT Next Visit Plan Pt. planning on continuing with an independent HEP and will call PT if any further issues.  PT recommended returning to MD if L hip/back symptoms persist.    PT Home Exercise Plan see sheet   Consulted and Agree with Plan of Care Patient      Patient will benefit from skilled therapeutic intervention in order to improve the following deficits and impairments:  Abnormal gait, Decreased activity tolerance, Decreased balance, Decreased endurance, Decreased range of motion, Difficulty walking, Decreased strength,  Impaired flexibility, Impaired perceived functional ability, Improper body mechanics, Postural dysfunction, Pain  Visit Diagnosis: Muscle weakness (generalized)  Other abnormalities of gait and mobility  Stiffness of left ankle, not elsewhere classified  Pain of left leg       G-Codes - [id number]-[id number]    Functional Assessment Tool Used (Outpatient Only) Clinical impression/ gait difficulty/ muscle weakness/ balance issues/ LEFS   Functional Limitation Mobility: Walking and moving around   Mobility: Walking and Moving Around Current Status ((X7939 At least 1 percent but less than 20 percent impaired, limited or restricted   Mobility: Walking and Moving Around Goal Status ((Q3009 At least 1 percent but less than 20 percent impaired, limited or restricted   Mobility: Walking and Moving  Around Discharge Status [phone]) At least 1 percent but less than 20 percent impaired, limited or restricted      Problem List Patient Active Problem List   Diagnosis Date Noted  . Gait instability 10/08/2016  . Left flank mass 04/30/2016  . Obesity, Class I, BMI 30-34.9 02/06/2016  . Hiatal hernia 02/06/2016  . Vitamin D deficiency 07/12/2015  . Vitamin B12 deficiency 07/12/2015  . IBS (irritable bowel syndrome) 07/11/2015  . Type 2 diabetes mellitus, uncontrolled, with neuropathy ([place]) 07/06/2015  . Hyperlipidemia 07/06/2015  . Hypertension 07/06/2015  . Colon polyp 07/06/2015  . 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 Presence of intraocular lens 05/24/2015  . Pseudophakia of right eye 05/24/2015  . Arthralgia of hip 07/25/2014  . Combined forms of age-related cataract of both eyes 07/12/2014   Pura Spice, PT, DPT # [phone] 02/11/2017, 5:37 PM  [place] [facility] [address] [place], [place], [address] Phone: [phone]   Fax:  [phone]  Name: Betty Ibarra MRN: [id number] Date of Birth: [birth year]

## 2017-02-20 ENCOUNTER — Telehealth: Payer: Self-pay

## 2017-02-20 DIAGNOSIS — I6503 Occlusion and stenosis of bilateral vertebral arteries: Secondary | ICD-10-CM | POA: Diagnosis not present

## 2017-02-20 DIAGNOSIS — I10 Essential (primary) hypertension: Secondary | ICD-10-CM | POA: Diagnosis not present

## 2017-02-20 DIAGNOSIS — Z880 Allergy status to penicillin: Secondary | ICD-10-CM | POA: Diagnosis not present

## 2017-02-20 DIAGNOSIS — G459 Transient cerebral ischemic attack, unspecified: Secondary | ICD-10-CM | POA: Diagnosis not present

## 2017-02-20 DIAGNOSIS — R4182 Altered mental status, unspecified: Secondary | ICD-10-CM | POA: Diagnosis not present

## 2017-02-20 DIAGNOSIS — I6523 Occlusion and stenosis of bilateral carotid arteries: Secondary | ICD-10-CM | POA: Diagnosis not present

## 2017-02-20 DIAGNOSIS — E1165 Type 2 diabetes mellitus with hyperglycemia: Secondary | ICD-10-CM | POA: Diagnosis not present

## 2017-02-20 DIAGNOSIS — R109 Unspecified abdominal pain: Secondary | ICD-10-CM | POA: Diagnosis not present

## 2017-02-20 DIAGNOSIS — Z7984 Long term (current) use of oral hypoglycemic drugs: Secondary | ICD-10-CM | POA: Diagnosis not present

## 2017-02-20 DIAGNOSIS — E7800 Pure hypercholesterolemia, unspecified: Secondary | ICD-10-CM | POA: Diagnosis not present

## 2017-02-20 DIAGNOSIS — E042 Nontoxic multinodular goiter: Secondary | ICD-10-CM | POA: Diagnosis not present

## 2017-02-20 DIAGNOSIS — Z882 Allergy status to sulfonamides status: Secondary | ICD-10-CM | POA: Diagnosis not present

## 2017-02-20 DIAGNOSIS — R4781 Slurred speech: Secondary | ICD-10-CM | POA: Diagnosis not present

## 2017-02-20 DIAGNOSIS — I771 Stricture of artery: Secondary | ICD-10-CM | POA: Diagnosis not present

## 2017-02-20 DIAGNOSIS — Z7982 Long term (current) use of aspirin: Secondary | ICD-10-CM | POA: Diagnosis not present

## 2017-02-20 DIAGNOSIS — Z79899 Other long term (current) drug therapy: Secondary | ICD-10-CM | POA: Diagnosis not present

## 2017-02-20 DIAGNOSIS — G8929 Other chronic pain: Secondary | ICD-10-CM | POA: Diagnosis not present

## 2017-02-20 NOTE — Telephone Encounter (Signed)
Could be TIA but probably just tired. Is she taking asa and Lipitor as prescribed? How are her sugars doing? Happy to see her this afternoon or Monday if she has further concerns.

## 2017-02-20 NOTE — Telephone Encounter (Signed)
Left message stating she was on phone late at night with her friend and her speech was blurred ans she was very tired. Her frined told her to call today and talk to [place] regarding if she could have been having a smalll stroke. Denies any other symptoms other than blurred speech. She also mentioned in the message maybe making appointment for this. What do you feel is best? Can you have someone call patient once you look over chart as I will be gone please?

## 2017-02-23 NOTE — Telephone Encounter (Signed)
Pt will be calling back after 1:30 to sched a follow up hosp visit with Dr Vicente Masson

## 2017-02-24 ENCOUNTER — Other Ambulatory Visit: Payer: Self-pay

## 2017-02-25 ENCOUNTER — Encounter: Payer: Self-pay | Admitting: Family Medicine

## 2017-02-25 ENCOUNTER — Ambulatory Visit (INDEPENDENT_AMBULATORY_CARE_PROVIDER_SITE_OTHER): Payer: Medicare Other | Admitting: Family Medicine

## 2017-02-25 VITALS — BP 170/88 | HR 74 | Temp 98.5°F | Resp 17 | Ht 63.0 in | Wt 163.0 lb

## 2017-02-25 DIAGNOSIS — G459 Transient cerebral ischemic attack, unspecified: Secondary | ICD-10-CM | POA: Diagnosis not present

## 2017-02-25 DIAGNOSIS — E1165 Type 2 diabetes mellitus with hyperglycemia: Secondary | ICD-10-CM | POA: Diagnosis not present

## 2017-02-25 DIAGNOSIS — E1140 Type 2 diabetes mellitus with diabetic neuropathy, unspecified: Secondary | ICD-10-CM | POA: Diagnosis not present

## 2017-02-25 DIAGNOSIS — IMO0002 Reserved for concepts with insufficient information to code with codable children: Secondary | ICD-10-CM

## 2017-02-25 DIAGNOSIS — E785 Hyperlipidemia, unspecified: Secondary | ICD-10-CM | POA: Diagnosis not present

## 2017-02-25 DIAGNOSIS — I10 Essential (primary) hypertension: Secondary | ICD-10-CM | POA: Diagnosis not present

## 2017-02-25 DIAGNOSIS — M79605 Pain in left leg: Secondary | ICD-10-CM

## 2017-02-25 MED ORDER — TRAMADOL HCL 50 MG PO TABS: 50.0000 mg | ORAL_TABLET | Freq: Three times a day (TID) | ORAL | 0 refills | Status: DC | PRN

## 2017-02-25 NOTE — Progress Notes (Signed)
Date:  02/25/2017   Name:  Betty Ibarra   DOB:  1940-12-05   MRN:  [id number]  PCP:  Adline Potter, MD    Chief Complaint: No chief complaint on file.   History of Present Illness:  This is a 76 y.o. female seen [facility] ED 02/20/17 for probable TIA, placed on asa/Plavix/Lipitor with neuro f/u 03/04/17. Had one minute of slurred speech, no weakness or facial droop. Not taking Lipitor because concerned re: side effects. L leg pain was improving with PT until saw new therapist who was more aggressive, pain worse now but still seeing, asks about tramadol for pain. Labs in ED showed a1c 8.4% and LDL131.   Review of Systems:  Review of Systems  Constitutional: Negative for chills, fatigue and fever.  Respiratory: Negative for cough and shortness of breath.   Cardiovascular: Negative for chest pain, palpitations and leg swelling.  Endocrine: Negative for polydipsia and polyuria.  Genitourinary: Negative for difficulty urinating.  Neurological: Negative for syncope, weakness and light-headedness.    Patient Active Problem List   Diagnosis Date Noted  . TIA (transient ischemic attack) 02/25/2017  . Gait instability 10/08/2016  . Left flank mass 04/30/2016  . Obesity, Class I, BMI 30-34.9 02/06/2016  . Hiatal hernia 02/06/2016  . Vitamin D deficiency 07/12/2015  . Vitamin B12 deficiency 07/12/2015  . IBS (irritable bowel syndrome) 07/11/2015  . Type 2 diabetes mellitus, uncontrolled, with neuropathy ([place]) 07/06/2015  . Hyperlipidemia 07/06/2015  . Hypertension 07/06/2015  . Colon polyp 07/06/2015  . 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 Presence of intraocular lens 05/24/2015  . Pseudophakia of right eye 05/24/2015  . Arthralgia of hip 07/25/2014  . Combined forms of age-related cataract of both eyes 07/12/2014    Prior to Admission medications   Medication Sig Start Date End Date Taking? Authorizing Provider  aspirin 81 MG chewable tablet CSW ONE T  QD FOR 20  DAYS 02/21/17  Yes [provider]  Cholecalciferol (VITAMIN D3) 2000 units TABS Take 2 tablets by mouth daily.   Yes [provider]  clopidogrel (PLAVIX) 75 MG tablet TK 1 T PO  QD FOR 21 DAYS 02/21/17  Yes [provider]  glipiZIDE (GLUCOTROL XL) 10 MG 24 hr tablet Take 2 tablets (20 mg total) by mouth daily with breakfast. 01/08/17  Yes Kajuan Guyton, Gwyndolyn Saxon, MD  glucose blood test strip Use as instructed 11/07/15  Yes Daishia Fetterly, Gwyndolyn Saxon, MD  Lancets [facility] ULTRASOFT) lancets Use as instructed 11/07/15  Yes Yesena Reaves, Gwyndolyn Saxon, MD  lisinopril (PRINIVIL,ZESTRIL) 20 MG tablet Take 1 tablet (20 mg total) by mouth daily. 01/23/17  Yes Cearra Portnoy, Gwyndolyn Saxon, MD  Magnesium 250 MG TABS Take by mouth daily.   Yes [provider]  metFORMIN (GLUCOPHAGE) 1000 MG tablet Take 1 tablet (1,000 mg total) by mouth 2 (two) times daily with a meal. 01/23/17  Yes Jayle Solarz, Gwyndolyn Saxon, MD  Multiple Vitamins-Minerals (MULTIVITAMIN WITH MINERALS) tablet Take 1 tablet by mouth daily.   Yes [provider]  TURMERIC PO Take by mouth.   Yes [provider]  vitamin B-12 (CYANOCOBALAMIN) 1000 MCG tablet Take 0.5 tablets (500 mcg total) by mouth daily. 10/09/16  Yes Shakiya Mcneary, Gwyndolyn Saxon, MD  zinc gluconate 50 MG tablet Take 50 mg by mouth daily.   Yes [provider]  atorvastatin (LIPITOR) 40 MG tablet Take 1 tablet (40 mg total) by mouth daily. Patient not taking: Reported on 02/25/2017 01/08/17   Adline Potter, MD  baclofen (LIORESAL) 10 MG tablet Take 10 mg  by mouth 3 (three) times daily as needed.    [provider]  traMADol (ULTRAM) 50 MG tablet Take 1 tablet (50 mg total) by mouth every 8 (eight) hours as needed. 02/25/17   Adline Potter, MD    Allergies  Allergen Reactions  . Sulfa Antibiotics Itching and Rash  . Penicillins Rash    Past Surgical History:  Procedure Laterality Date  . CHOLECYSTECTOMY    . COLONOSCOPY  2010, 2015   one polyp  . PARTIAL HYSTERECTOMY      one ovary remains    Social History  Substance Use Topics  . Smoking status: Never Smoker  . Smokeless tobacco: Never Used  . Alcohol use No     Comment: glass wine every 3-6 months    Family History  Problem Relation Age of Onset  . Colon cancer Mother   . Diabetes Father   . Diabetes Maternal Grandmother   . Diabetes Paternal Grandmother     Medication list has been reviewed and updated.  Physical Examination: BP (!) 170/88 (BP Location: Right Arm, Patient Position: Sitting, Cuff Size: Normal)   Pulse 74   Temp 98.5 F (36.9 C) (Oral)   Resp 17   Ht 5\' 3"  (1.6 m)   Wt 163 lb (73.9 kg)   SpO2 98%   BMI 28.87 kg/m   Physical Exam  Constitutional: She appears well-developed and well-nourished.  Cardiovascular: Normal rate, regular rhythm and normal heart sounds.   Pulmonary/Chest: Effort normal and breath sounds normal.  Musculoskeletal: She exhibits no edema.  Neurological: She is alert.  No facial droop, speech normal  Skin: Skin is warm and dry.  Psychiatric: She has a normal mood and affect. Her behavior is normal.  Nursing note and vitals reviewed.   Assessment and Plan:  1. Transient cerebral ischemia, unspecified type Cont asa/Plavix, neuro f/u 03/04/17  2. Essential hypertension Poor control today but may be due to TIA, normal last visit, cont lisinopril, consider amlodipine if persists  3. Type 2 diabetes mellitus, uncontrolled, with neuropathy (HCC) A1c improving, cont metformin/glipizide  4. Hyperlipidemia, unspecified hyperlipidemia type Encouraged Lipitor use, will discuss further with neurologist  5. Left leg pain Cont PT, tramadol prn pain, use sparingly  Return in about 4 weeks (around 03/25/2017).  Satira Anis. [facility]  02/25/2017

## 2017-03-04 DIAGNOSIS — R29818 Other symptoms and signs involving the nervous system: Secondary | ICD-10-CM | POA: Insufficient documentation

## 2017-03-04 DIAGNOSIS — IMO0001 Reserved for inherently not codable concepts without codable children: Secondary | ICD-10-CM | POA: Insufficient documentation

## 2017-03-04 DIAGNOSIS — Z531 Procedure and treatment not carried out because of patient's decision for reasons of belief and group pressure: Secondary | ICD-10-CM | POA: Insufficient documentation

## 2017-03-10 ENCOUNTER — Encounter: Payer: Self-pay | Admitting: Physical Therapy

## 2017-03-10 ENCOUNTER — Ambulatory Visit: Payer: Medicare Other | Attending: Family Medicine | Admitting: Physical Therapy

## 2017-03-10 DIAGNOSIS — M25672 Stiffness of left ankle, not elsewhere classified: Secondary | ICD-10-CM | POA: Diagnosis not present

## 2017-03-10 DIAGNOSIS — M79605 Pain in left leg: Secondary | ICD-10-CM | POA: Insufficient documentation

## 2017-03-10 DIAGNOSIS — M6281 Muscle weakness (generalized): Secondary | ICD-10-CM | POA: Insufficient documentation

## 2017-03-10 DIAGNOSIS — R2689 Other abnormalities of gait and mobility: Secondary | ICD-10-CM | POA: Diagnosis not present

## 2017-03-13 NOTE — Therapy (Addendum)
[facility] [address] [place], [place], [address] Phone: [phone]   Fax:  [phone]  Physical Therapy Treatment  Patient Details  Name: Betty Ibarra MRN: [id number] Date of Birth: 07-08-1941 Referring Provider: Adline Potter, MD  Encounter Date: 03/10/2017         PT End of Session - 03/13/17 1335    Visit Number 9   Number of Visits 13   Date for PT Re-Evaluation 04/07/17   Authorization - Visit Number 9   Authorization - Number of Visits 18   PT Start Time 0174   PT Stop Time 9449   PT Time Calculation (min) 54 min   Activity Tolerance Patient tolerated treatment well;Patient limited by pain   Behavior During Therapy [facility] for tasks assessed/performed        Past Medical History:  Diagnosis Date  . Arthritis   . Diabetes mellitus without complication ([place])   . Hyperlipidemia   . Hypertension   . S/P partial hysterectomy     Past Surgical History:  Procedure Laterality Date  . CHOLECYSTECTOMY    . COLONOSCOPY  2010, 2015   one polyp  . PARTIAL HYSTERECTOMY     one ovary remains    There were no vitals filed for this visit.  LEFS: 13 out of 80. (marked decrease from initial evaluation).     Objective:  There.ex.: Discussed/reviewed HEP and use of SPC vs. RW.   Progression of TrA ex. In supine position (see handouts).  Manual tx.:  Supine LE stretches (as tolerated).  Focus on hip/piriformis/hamstring flexibility.  L ankle mobs./ DF stretches.  Supine L hip LAD/ grade II-III AP mobs 3x20 sec.  Pt. Will continue with HEP and return to PT 1x/week with HEP focus.                 Pt response for medical necessity:  Benefits from LE strengthening/ balance tasks to improve pain-free mobility. PT encouraged pt to increase daily mobility/ walking.          PT Long Term Goals - 03/13/17 1141      PT LONG TERM GOAL #1   Title Patient will improve LEFS score to > 40/80 to improve quality of life.     Baseline 3/27: 22/80.  5/29: 23/80  (minimal change).  Marked decrease: 13/80 on 6/26   Time 4   Period Weeks   Status Not Met   Target Date 05/08/17     PT LONG TERM GOAL #2   Title Patient will improve BERG score to 54/56 to increase safe ambulation in the community.    Baseline 4/25: Berg 53/56.     Time 4   Period Weeks   Status Partially Met   Target Date 05/08/17     PT LONG TERM GOAL #3   Title Pt. will be able to achieve active neutral DF with LLE to improve gait mechanics   Baseline AROM: 0 deg.   Time 4   Period Weeks   Status Achieved     PT LONG TERM GOAL #4   Title Patient will demonstrate 4/5 strength in LLE to increase functional mobility and safety in community ambulation.    Baseline B LE muscle strength grossly 4/5.  Tenderness in L hip/LE   Time 4   Period Weeks   Status Partially Met   Target Date 05/08/17     PT LONG TERM GOAL #5   Title Pt. able to ambulate 10  minutes with normalized gait pattern and no assistive device on level surfaces safely.     Baseline pt. currently using RW or SPC to ambulate short household distances secondary to increase L LE pain.      Time 4   Period Weeks   Status Not Met   Target Date 05/08/17       Patient will benefit from skilled therapeutic intervention in order to improve the following deficits and impairments:  Abnormal gait, Decreased activity tolerance, Decreased balance, Decreased endurance, Decreased range of motion, Difficulty walking, Decreased strength, Impaired flexibility, Impaired perceived functional ability, Improper body mechanics, Postural dysfunction, Pain  Visit Diagnosis: Muscle weakness (generalized)  Other abnormalities of gait and mobility  Stiffness of left ankle, not elsewhere classified  Pain of left leg     Problem List Patient Active Problem List   Diagnosis Date Noted  . TIA (transient ischemic attack) 02/25/2017  . Gait instability 10/08/2016  . Left flank mass 04/30/2016   . Obesity, Class I, BMI 30-34.9 02/06/2016  . Hiatal hernia 02/06/2016  . Vitamin D deficiency 07/12/2015  . Vitamin B12 deficiency 07/12/2015  . IBS (irritable bowel syndrome) 07/11/2015  . Type 2 diabetes mellitus, uncontrolled, with neuropathy ([place]) 07/06/2015  . Hyperlipidemia 07/06/2015  . Hypertension 07/06/2015  . Colon polyp 07/06/2015  . 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 Presence of intraocular lens 05/24/2015  . Pseudophakia of right eye 05/24/2015  . Arthralgia of hip 07/25/2014  . Combined forms of age-related cataract of both eyes 07/12/2014   Pura Spice, PT, DPT # [id number] 03/13/2017, 11:44 AM  [facility] [address] [place], [place], [address] Phone: [phone]   Fax:  [phone]  Name: Shanel Prazak MRN: [id number] Date of Birth: 08/01/1941

## 2017-03-17 ENCOUNTER — Ambulatory Visit: Payer: Medicare Other | Attending: Family Medicine | Admitting: Physical Therapy

## 2017-03-17 DIAGNOSIS — M79605 Pain in left leg: Secondary | ICD-10-CM | POA: Insufficient documentation

## 2017-03-17 DIAGNOSIS — M6281 Muscle weakness (generalized): Secondary | ICD-10-CM | POA: Diagnosis not present

## 2017-03-17 DIAGNOSIS — R2689 Other abnormalities of gait and mobility: Secondary | ICD-10-CM | POA: Insufficient documentation

## 2017-03-17 DIAGNOSIS — M25672 Stiffness of left ankle, not elsewhere classified: Secondary | ICD-10-CM

## 2017-03-18 NOTE — Therapy (Signed)
[facility] [address] [place], [place], [address] Phone: [phone]   Fax:  [phone]  Physical Therapy Treatment  Patient Details  Name: Betty Ibarra MRN: [id number] Date of Birth: Feb 21, 1941 Referring Provider: Adline Potter, MD  Encounter Date: 03/17/2017      PT End of Session - 03/18/17 1515    Visit Number 10   Number of Visits 13   Date for PT Re-Evaluation 04/07/17   Authorization - Visit Number 10   Authorization - Number of Visits 18   PT Start Time 1245   PT Stop Time 8099   PT Time Calculation (min) 58 min   Activity Tolerance Patient tolerated treatment well;Patient limited by pain   Behavior During Therapy [facility] for tasks assessed/performed      Past Medical History:  Diagnosis Date  . Arthritis   . Diabetes mellitus without complication ([place])   . Hyperlipidemia   . Hypertension   . S/P partial hysterectomy     Past Surgical History:  Procedure Laterality Date  . CHOLECYSTECTOMY    . COLONOSCOPY  2010, 2015   one polyp  . PARTIAL HYSTERECTOMY     one ovary remains    There were no vitals filed for this visit.      Subjective Assessment - 03/18/17 1514    Pertinent History uncontrolled diabetes, neuropathy, stenosis, hiatal hernia   Limitations Standing;Lifting;Walking;House hold activities   How long can you sit comfortably? comfortably   How long can you stand comfortably? not very long   How long can you walk comfortably? about 2 blocks   Patient Stated Goals return to household chores, walk for longer, decrease pain   Currently in Pain? Yes   Pain Score 2    Pain Location Leg   Pain Orientation Left   Pain Descriptors / Indicators Aching                                      PT Long Term Goals - 02/11/17 1730      PT LONG TERM GOAL #1   Title Patient will improve LEFS score to > 40/80 to improve quality of life.    Baseline 3/27: 22/80.  5/29:  23/80  (minimal change)   Time 4   Period Weeks   Status Not Met     PT LONG TERM GOAL #2   Title Patient will improve BERG score to 54/56 to increase safe ambulation in the community.    Baseline 4/25: Berg 53/56.     Time 4   Period Weeks   Status Partially Met     PT LONG TERM GOAL #3   Title Pt. will be able to achieve active neutral DF with LLE to improve gait mechanics   Baseline AROM: 0 deg.   Time 4   Period Weeks   Status Achieved     PT LONG TERM GOAL #4   Title Patient will demonstrate 4/5 strength in LLE to increase functional mobility and safety in community ambulation.    Baseline B LE muscle strength grossly 4/5.  Tenderness in L hip/LE   Time 4   Period Weeks   Status Partially Met     PT LONG TERM GOAL #5   Title Pt. able to ambulate 10 minutes with normalized gait pattern and no assistive device on level surfaces safely.     Baseline pt.  currently using RW or SPC to ambulate short household distances secondary to increase L LE pain.      Time 4   Period Weeks   Status Not Met               Plan - 03/18/17 1515    Clinical Presentation Stable   Clinical Decision Making Moderate   Rehab Potential Fair   Clinical Impairments Affecting Rehab Potential diabetes, hiatial hernia   PT Frequency 1x / week   PT Duration 4 weeks   PT Treatment/Interventions ADLs/Self Care Home Management;Cryotherapy;Electrical Stimulation;DME Instruction;Ultrasound;Traction;Moist Heat;Iontophoresis 67m/ml Dexamethasone;Gait training;Stair training;Functional mobility training;Therapeutic activities;Therapeutic exercise;Balance training;Orthotic Fit/Training;Patient/family education;Neuromuscular re-education;Manual techniques;Compression bandaging;Taping;Splinting;Energy conservation;Passive range of motion   PT Next Visit Plan Increase L hip mobility/ strengthening/ pain mgmt. (1x/week).     PT Home Exercise Plan see sheet   Consulted and Agree with Plan of Care Patient       Patient will benefit from skilled therapeutic intervention in order to improve the following deficits and impairments:  Abnormal gait, Decreased activity tolerance, Decreased balance, Decreased endurance, Decreased range of motion, Difficulty walking, Decreased strength, Impaired flexibility, Impaired perceived functional ability, Improper body mechanics, Postural dysfunction, Pain  Visit Diagnosis: Muscle weakness (generalized)  Other abnormalities of gait and mobility  Stiffness of left ankle, not elsewhere classified  Pain of left leg     Problem List Patient Active Problem List   Diagnosis Date Noted  . TIA (transient ischemic attack) 02/25/2017  . Gait instability 10/08/2016  . Left flank mass 04/30/2016  . Obesity, Class I, BMI 30-34.9 02/06/2016  . Hiatal hernia 02/06/2016  . Vitamin D deficiency 07/12/2015  . Vitamin B12 deficiency 07/12/2015  . IBS (irritable bowel syndrome) 07/11/2015  . Type 2 diabetes mellitus, uncontrolled, [place] 07/06/2015  . Hyperlipidemia 07/06/2015  . Hypertension 07/06/2015  . Colon polyp 07/06/2015  . 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 Presence of intraocular lens 05/24/2015  . Pseudophakia of right eye 05/24/2015  . Arthralgia of hip 07/25/2014  . Combined forms of age-related cataract of both eyes 07/12/2014    SPura Spice7/12/2016, 3:18 PM  [facility] [place] [address]Phone: [phone]  Fax:  [phone] Name: Betty BarkMRN: [id number] of Birth: [birth year]

## 2017-03-24 ENCOUNTER — Ambulatory Visit: Payer: Medicare Other | Admitting: Physical Therapy

## 2017-03-24 ENCOUNTER — Encounter: Payer: Self-pay | Admitting: Physical Therapy

## 2017-03-24 DIAGNOSIS — R2689 Other abnormalities of gait and mobility: Secondary | ICD-10-CM

## 2017-03-24 DIAGNOSIS — M79605 Pain in left leg: Secondary | ICD-10-CM | POA: Diagnosis not present

## 2017-03-24 DIAGNOSIS — M6281 Muscle weakness (generalized): Secondary | ICD-10-CM

## 2017-03-24 DIAGNOSIS — M25672 Stiffness of left ankle, not elsewhere classified: Secondary | ICD-10-CM

## 2017-03-25 NOTE — Therapy (Signed)
[facility] [address] [place], [place], [address] Phone: [phone]   Fax:  [phone]  Physical Therapy Treatment  Patient Details  Name: Betty Ibarra MRN: [id number] Date of Birth: October 22, 1940 Referring Provider: Adline Potter, MD  Encounter Date: 03/24/2017      PT End of Session - 03/25/17 1317    Visit Number 11   Number of Visits 13   Date for PT Re-Evaluation 04/07/17   Authorization - Visit Number 11   Authorization - Number of Visits 18   PT Start Time 0802   PT Stop Time 1358   PT Time Calculation (min) 53 min   Equipment Utilized During Treatment Gait belt   Activity Tolerance Patient tolerated treatment well;Patient limited by pain   Behavior During Therapy [facility] for tasks assessed/performed      Past Medical History:  Diagnosis Date  . Arthritis   . Diabetes mellitus without complication ([place])   . Hyperlipidemia   . Hypertension   . S/P partial hysterectomy     Past Surgical History:  Procedure Laterality Date  . CHOLECYSTECTOMY    . COLONOSCOPY  2010, 2015   one polyp  . PARTIAL HYSTERECTOMY     one ovary remains    There were no vitals filed for this visit.      Subjective Assessment - 03/25/17 1317    Pertinent History uncontrolled diabetes, neuropathy, stenosis, hiatal hernia   Limitations Standing;Lifting;Walking;House hold activities   How long can you sit comfortably? comfortably   How long can you stand comfortably? not very long   How long can you walk comfortably? about 2 blocks   Patient Stated Goals return to household chores, walk for longer, decrease pain   Currently in Pain? Yes   Pain Score 2    Pain Location Leg   Pain Orientation Left   Pain Descriptors / Indicators Aching                                      PT Long Term Goals - 02/11/17 1730      PT LONG TERM GOAL #1   Title Patient will improve LEFS score to > 40/80 to improve  quality of life.    Baseline 3/27: 22/80.  5/29: 23/80  (minimal change)   Time 4   Period Weeks   Status Not Met     PT LONG TERM GOAL #2   Title Patient will improve BERG score to 54/56 to increase safe ambulation in the community.    Baseline 4/25: Berg 53/56.     Time 4   Period Weeks   Status Partially Met     PT LONG TERM GOAL #3   Title Pt. will be able to achieve active neutral DF with LLE to improve gait mechanics   Baseline AROM: 0 deg.   Time 4   Period Weeks   Status Achieved     PT LONG TERM GOAL #4   Title Patient will demonstrate 4/5 strength in LLE to increase functional mobility and safety in community ambulation.    Baseline B LE muscle strength grossly 4/5.  Tenderness in L hip/LE   Time 4   Period Weeks   Status Partially Met     PT LONG TERM GOAL #5   Title Pt. able to ambulate 10 minutes with normalized gait pattern and no assistive device on level  surfaces safely.     Baseline pt. currently using RW or SPC to ambulate short household distances secondary to increase L LE pain.      Time 4   Period Weeks   Status Not Met               Plan - 03/25/17 1318    Clinical Presentation Stable   Clinical Decision Making Moderate   Rehab Potential Fair   Clinical Impairments Affecting Rehab Potential diabetes, hiatial hernia   PT Frequency 1x / week   PT Duration 4 weeks   PT Treatment/Interventions ADLs/Self Care Home Management;Cryotherapy;Electrical Stimulation;DME Instruction;Ultrasound;Traction;Moist Heat;Iontophoresis 4mg/ml Dexamethasone;Gait training;Stair training;Functional mobility training;Therapeutic activities;Therapeutic exercise;Balance training;Orthotic Fit/Training;Patient/family education;Neuromuscular re-education;Manual techniques;Compression bandaging;Taping;Splinting;Energy conservation;Passive range of motion   PT Next Visit Plan Increase L hip mobility/ strengthening/ pain mgmt. (1x/week).     PT Home Exercise Plan see sheet    Consulted and Agree with Plan of Care Patient      Patient will benefit from skilled therapeutic intervention in order to improve the following deficits and impairments:  Abnormal gait, Decreased activity tolerance, Decreased balance, Decreased endurance, Decreased range of motion, Difficulty walking, Decreased strength, Impaired flexibility, Impaired perceived functional ability, Improper body mechanics, Postural dysfunction, Pain  Visit Diagnosis: Muscle weakness (generalized)  Other abnormalities of gait and mobility  Stiffness of left ankle, not elsewhere classified  Pain of left leg     Problem List Patient Active Problem List   Diagnosis Date Noted  . TIA (transient ischemic attack) 02/25/2017  . Gait instability 10/08/2016  . Left flank mass 04/30/2016  . Obesity, Class I, BMI 30-34.9 02/06/2016  . Hiatal hernia 02/06/2016  . Vitamin D deficiency 07/12/2015  . Vitamin B12 deficiency 07/12/2015  . IBS (irritable bowel syndrome) 07/11/2015  . Type 2 diabetes mellitus, uncontrolled, with neuropathy (HCC) 07/06/2015  . Hyperlipidemia 07/06/2015  . Hypertension 07/06/2015  . Colon polyp 07/06/2015  . 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 Presence of intraocular lens 05/24/2015  . Pseudophakia of right eye 05/24/2015  . Arthralgia of hip 07/25/2014  . Combined forms of age-related cataract of both eyes 07/12/2014    Sherk, Michael C 03/25/2017, 1:19 PM  [facility] [address] [place], [place], [address] Phone: [phone]   Fax:  [phone]  Name: Jaliah Teodoro MRN: [id number] Date of Birth: 02/05/1941   

## 2017-03-31 ENCOUNTER — Encounter: Payer: Medicare Other | Admitting: Physical Therapy

## 2017-04-07 ENCOUNTER — Ambulatory Visit: Payer: Medicare Other | Admitting: Physical Therapy

## 2017-04-07 DIAGNOSIS — M25672 Stiffness of left ankle, not elsewhere classified: Secondary | ICD-10-CM | POA: Diagnosis not present

## 2017-04-07 DIAGNOSIS — M79605 Pain in left leg: Secondary | ICD-10-CM | POA: Diagnosis not present

## 2017-04-07 DIAGNOSIS — M6281 Muscle weakness (generalized): Secondary | ICD-10-CM

## 2017-04-07 DIAGNOSIS — R2689 Other abnormalities of gait and mobility: Secondary | ICD-10-CM

## 2017-04-07 NOTE — Addendum Note (Signed)
Addended by: Dorcas Carrow C on: 04/07/2017 11:51 AM   Modules accepted: Orders

## 2017-04-07 NOTE — Therapy (Signed)
[facility] [address]. [place], [place], [address] Phone: [phone]   Fax:  [phone]  Physical Therapy Treatment  Patient Details  Name: Betty Ibarra MRN: [id number] Date of Birth: [birth year] Referring Provider: Adline Potter, MD  Encounter Date: 04/07/2017      PT End of Session - 04/07/17 1314    Visit Number 12   Number of Visits 13   Date for PT Re-Evaluation 04/07/17   Authorization - Visit Number 12   Authorization - Number of Visits 18   PT Start Time 9798   PT Stop Time 1401   PT Time Calculation (min) 56 min   Equipment Utilized During Treatment Gait belt   Activity Tolerance Patient tolerated treatment well;Patient limited by pain   Behavior During Therapy [facility] for tasks assessed/performed      Past Medical History:  Diagnosis Date  . Arthritis   . Diabetes mellitus without complication ([place])   . Hyperlipidemia   . Hypertension   . S/P partial hysterectomy     Past Surgical History:  Procedure Laterality Date  . CHOLECYSTECTOMY    . COLONOSCOPY  2010, 2015   one polyp  . PARTIAL HYSTERECTOMY     one ovary remains    There were no vitals filed for this visit.      Subjective Assessment - 04/07/17 1309    Subjective Pt. states she had a really good day yesterday and was able to complete several household chores.  Pt. is a little more sore in L hip/ adductor muscle today.  Pt. took her last Ultram this morning.  Pt. has scheduled f/u MD appt. next Friday with [facility] PA-C (?injection).     Pertinent History uncontrolled diabetes, neuropathy, stenosis, hiatal hernia   Limitations Standing;Lifting;Walking;House hold activities   How long can you sit comfortably? comfortably   How long can you stand comfortably? not very long   How long can you walk comfortably? about 2 blocks   Patient Stated Goals return to household chores, walk for longer, decrease pain   Currently in Pain? Yes   Pain  Score 5    Pain Location Leg   Pain Orientation Left   Pain Descriptors / Indicators Aching   Pain Type Chronic pain;Neuropathic pain      LEFS: 32/80 Berg balance test: 54/56.     Hip flexor: L 3+/5       R 4/5  Quad: L 3+/5             R 4/5   Hamstring: L 5/5        R 5/5   Hip abd./adductor: L 5/5          R 5/5  Ankle DF: L 3+/5      R 4+/5     There.ex.:    Supine LE/lumbar stretches (hamstring/ hip/ trunk rotn.)  Standing 1.5# hip flexion/ abd./ extension (moderate cuing to correct L hip compensatory movements patterns, esp. With hip abd./ext.) 20x2.    Standing 1.5# toe raises (difficulty without posterior lean)/ hip flexion 20x2.   Seated DF 20x (improvement as compared to standing there.ex.).   Nustep L6 B UE/LE 10 min.           PT Long Term Goals - 04/08/17 1651      PT LONG TERM GOAL #1   Title Patient will improve LEFS score to > 40/80 to improve quality of life.    Baseline 3/27: 22/80.  5/29: 23/80  (  minimal change).  Marked decrease: 13/80 on 6/26.  LEFS: 32 out of 80 on 04-22-2023 (best score).   Time 4   Period Weeks   Status Partially Met     PT LONG TERM GOAL #2   Title Patient will improve BERG score to 54/56 to increase safe ambulation in the community.    Baseline 4/25Merrilee Jansky 53/562024-08-07: 54/56  (max potential).     Time 4   Period Weeks   Status Achieved     PT LONG TERM GOAL #3   Title Pt. will be able to achieve active neutral DF with LLE to improve gait mechanics   Baseline AROM: 0 deg.   Time 4   Period Weeks   Status Achieved     PT LONG TERM GOAL #4   Title Patient will demonstrate 4/5 strength in LLE to increase functional mobility and safety in community ambulation.    Baseline see note   Time 4   Period Weeks   Status Partially Met     PT LONG TERM GOAL #5   Title Pt. able to ambulate 10 minutes with normalized gait pattern and no assistive device on level surfaces safely.     Baseline pt. currently using RW or  SPC to ambulate short household distances secondary to increase L LE pain.      Time 4   Period Weeks   Status Not Met               Plan - 04/21/17 1314    Clinical Presentation Stable   Clinical Decision Making Moderate   Rehab Potential Fair   Clinical Impairments Affecting Rehab Potential diabetes, hiatial hernia   PT Frequency 1x / week   PT Duration 4 weeks   PT Treatment/Interventions ADLs/Self Care Home Management;Cryotherapy;Electrical Stimulation;DME Instruction;Ultrasound;Traction;Moist Heat;Iontophoresis 77m/ml Dexamethasone;Gait training;Stair training;Functional mobility training;Therapeutic activities;Therapeutic exercise;Balance training;Orthotic Fit/Training;Patient/family education;Neuromuscular re-education;Manual techniques;Compression bandaging;Taping;Splinting;Energy conservation;Passive range of motion   PT Next Visit Plan Pt. will call PT after MD f/u next Friday to determine POC.  Pt. states she understands HEP.     PT Home Exercise Plan see sheet   Consulted and Agree with Plan of Care Patient      Patient will benefit from skilled therapeutic intervention in order to improve the following deficits and impairments:  Abnormal gait, Decreased activity tolerance, Decreased balance, Decreased endurance, Decreased range of motion, Difficulty walking, Decreased strength, Impaired flexibility, Impaired perceived functional ability, Improper body mechanics, Postural dysfunction, Pain  Visit Diagnosis: Muscle weakness (generalized)  Other abnormalities of gait and mobility  Stiffness of left ankle, not elsewhere classified  Pain of left leg       G-Codes - 008/07/20181708    Functional Assessment Tool Used (Outpatient Only) Clinical impression/ gait difficulty/ muscle weakness/ balance issues/ LEFS   Functional Limitation Mobility: Walking and moving around   Mobility: Walking and Moving Around Current Status (([id number] At least 1 percent but less than 20  percent impaired, limited or restricted   Mobility: Walking and Moving Around Goal Status [phone] At least 1 percent but less than 20 percent impaired, limited or restricted   Mobility: Walking and Moving Around Discharge Status [phone] At least 1 percent but less than 20 percent impaired, limited or restricted      Problem List Patient Active Problem List   Diagnosis Date Noted  . TIA (transient ischemic attack) 02/25/2017  . Gait instability 10/08/2016  . Left flank mass 04/30/2016  . Obesity, Class  I, BMI 30-34.9 02/06/2016  . Hiatal hernia 02/06/2016  . Vitamin D deficiency 07/12/2015  . Vitamin B12 deficiency 07/12/2015  . IBS (irritable bowel syndrome) 07/11/2015  . Type 2 diabetes mellitus, uncontrolled, with neuropathy ([place]) 07/06/2015  . Hyperlipidemia 07/06/2015  . Hypertension 07/06/2015  . Colon polyp 07/06/2015  . 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 Presence of intraocular lens 05/24/2015  . Pseudophakia of right eye 05/24/2015  . Arthralgia of hip 07/25/2014  . Combined forms of age-related cataract of both eyes 07/12/2014    Pura Spice 04/08/2017, 5:09 PM  [facility] [address] [place], [place], [address] Phone: [phone]   Fax:  [phone]  Name: Betty Ibarra MRN: [id number] Date of Birth: 11-03-1940

## 2017-04-08 ENCOUNTER — Ambulatory Visit: Payer: Medicare Other | Admitting: Family Medicine

## 2017-04-17 DIAGNOSIS — M1612 Unilateral primary osteoarthritis, left hip: Secondary | ICD-10-CM | POA: Diagnosis not present

## 2017-04-30 ENCOUNTER — Other Ambulatory Visit: Payer: Self-pay

## 2017-04-30 ENCOUNTER — Other Ambulatory Visit: Payer: Self-pay | Admitting: Family Medicine

## 2017-04-30 MED ORDER — ONETOUCH ULTRASOFT LANCETS MISC: 12 refills | Status: DC

## 2017-04-30 MED ORDER — GLUCOSE BLOOD VI STRP: ORAL_STRIP | 12 refills | Status: DC

## 2017-05-01 DIAGNOSIS — M1612 Unilateral primary osteoarthritis, left hip: Secondary | ICD-10-CM | POA: Diagnosis not present

## 2017-05-12 ENCOUNTER — Ambulatory Visit: Payer: Medicare Other | Admitting: Family Medicine

## 2017-05-15 DIAGNOSIS — M1612 Unilateral primary osteoarthritis, left hip: Secondary | ICD-10-CM | POA: Diagnosis not present

## 2017-05-20 ENCOUNTER — Telehealth: Payer: Self-pay

## 2017-05-21 NOTE — Telephone Encounter (Signed)
Called to get her in for BP appt and she declined keeping Oct appt.

## 2017-06-10 ENCOUNTER — Other Ambulatory Visit: Payer: Self-pay | Admitting: Family Medicine

## 2017-06-10 MED ORDER — GLIPIZIDE ER 10 MG PO TB24: 20.0000 mg | ORAL_TABLET | Freq: Every day | ORAL | 3 refills | Status: DC

## 2017-06-11 ENCOUNTER — Other Ambulatory Visit: Payer: Self-pay

## 2017-06-11 MED ORDER — FREESTYLE FREEDOM LITE W/DEVICE KIT: PACK | 0 refills | Status: DC

## 2017-06-11 MED ORDER — GLIPIZIDE ER 10 MG PO TB24: 20.0000 mg | ORAL_TABLET | Freq: Every day | ORAL | 3 refills | Status: DC

## 2017-06-12 ENCOUNTER — Other Ambulatory Visit: Payer: Self-pay

## 2017-06-12 MED ORDER — FREESTYLE FREEDOM LITE W/DEVICE KIT: PACK | 0 refills | Status: DC

## 2017-06-12 MED ORDER — GLIPIZIDE 5 MG PO TABS: 5.0000 mg | ORAL_TABLET | Freq: Two times a day (BID) | ORAL | 3 refills | Status: DC

## 2017-06-26 ENCOUNTER — Other Ambulatory Visit: Payer: Self-pay

## 2017-06-26 MED ORDER — GLUCOSE BLOOD VI STRP: ORAL_STRIP | 12 refills | Status: DC

## 2017-06-26 MED ORDER — FREESTYLE FREEDOM LITE W/DEVICE KIT: PACK | 0 refills | Status: AC

## 2017-07-12 IMAGING — CT CT ABD-PELV W/ CM
2 of 5 series · 16 of 46 positions shown, 18 images · IV contrast (iopamidol)
Comparison: None.

CLINICAL DATA: Upper abdominal pain since January 2016. Urinary tract
infection for 1 week. History of cholecystectomy, hysterectomy,
hypertension and diabetes.

EXAM:
CT ABDOMEN AND PELVIS WITH CONTRAST
TECHNIQUE: Multidetector CT imaging of the abdomen and pelvis was performed
using the standard protocol following bolus administration of
intravenous contrast.
CONTRAST:  100mL T77H11-SEE IOPAMIDOL (T77H11-SEE) INJECTION 61%

[Series 2: axial soft tissue · axial · 0.64mm/px · z∈[-824,-409]mm · 13 of 93 slices shown, 15 images]
[im 5/93  soft-tissue]
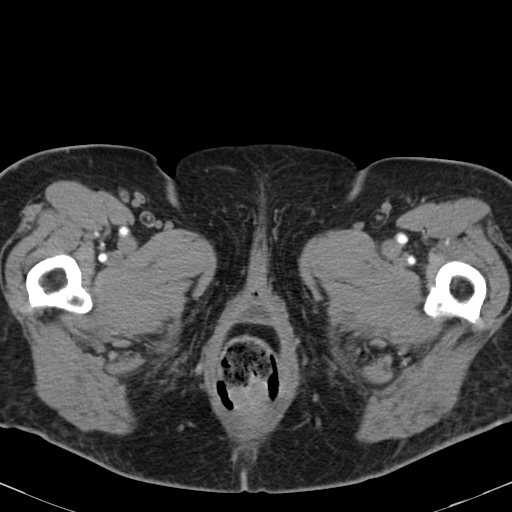
[im 5/93  bone]
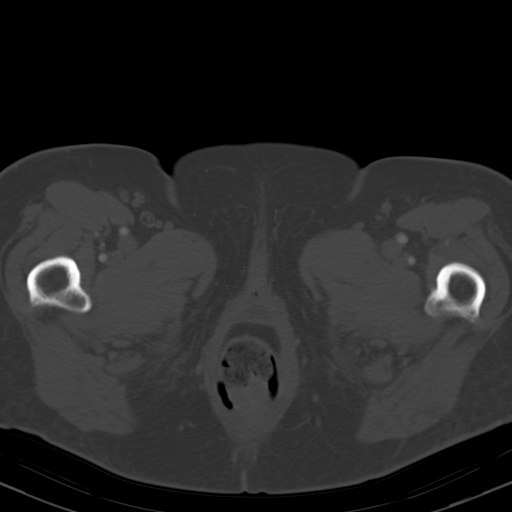
[im 15/93  soft-tissue]
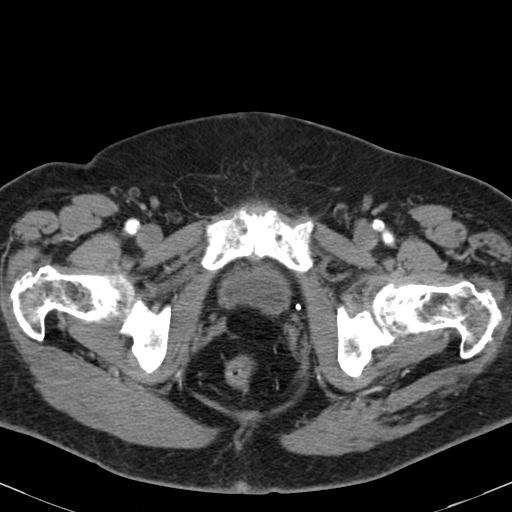
[im 20/93  soft-tissue]
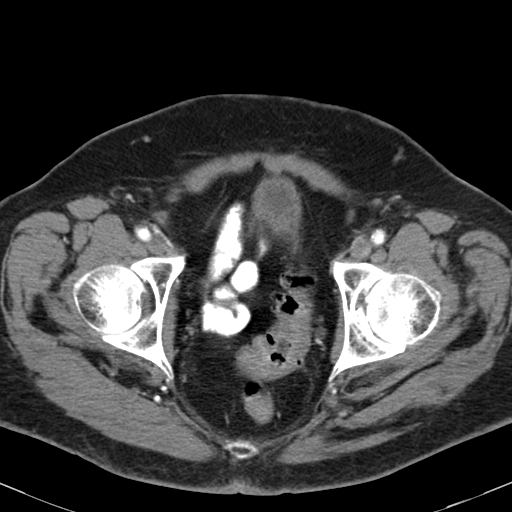
[im 25/93  soft-tissue]
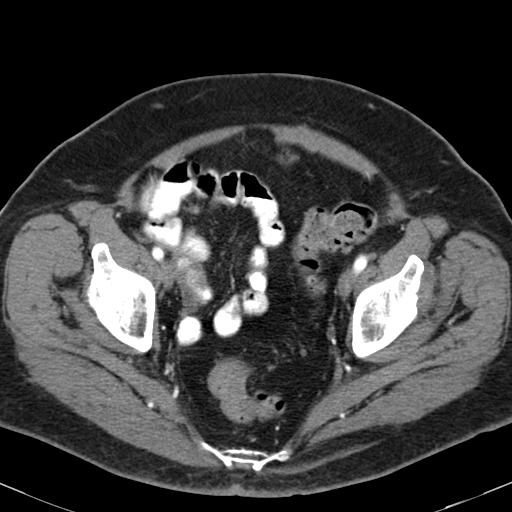
[im 34/93  soft-tissue]
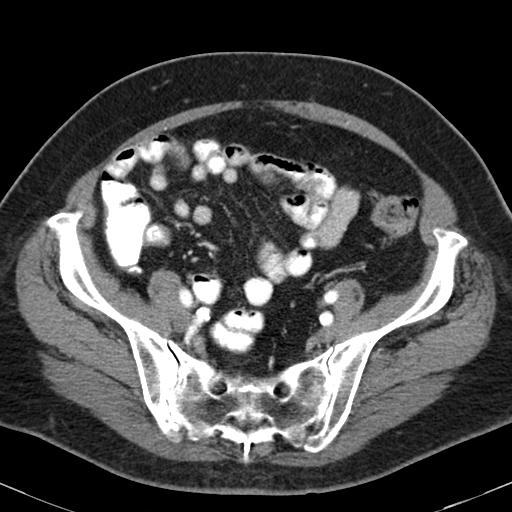
[im 39/93  soft-tissue]
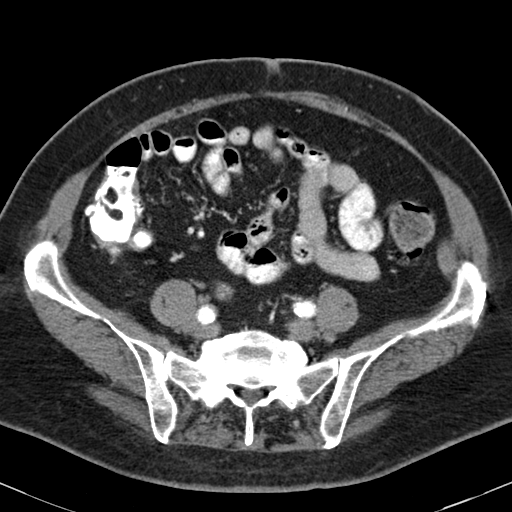
[im 49/93  soft-tissue]
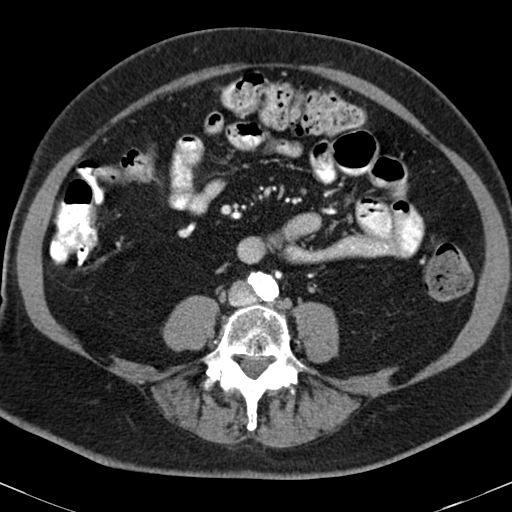
[im 54/93  soft-tissue]
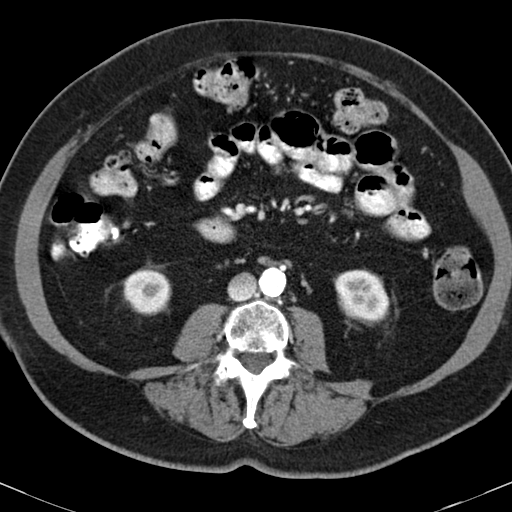
[im 59/93  soft-tissue]
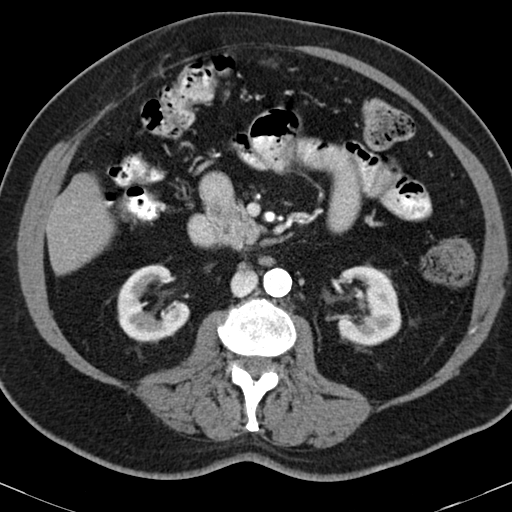
[im 59/93  bone]
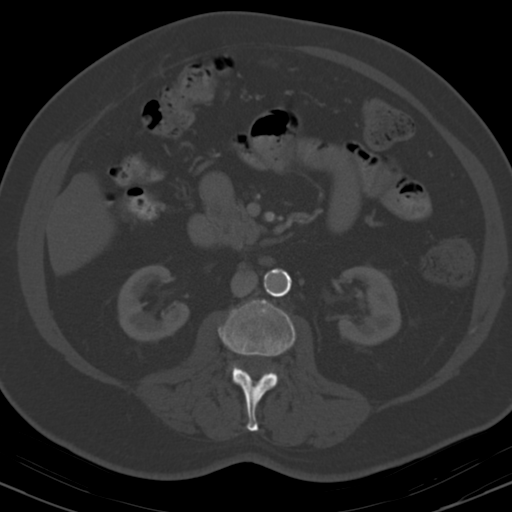
[im 68/93  soft-tissue]
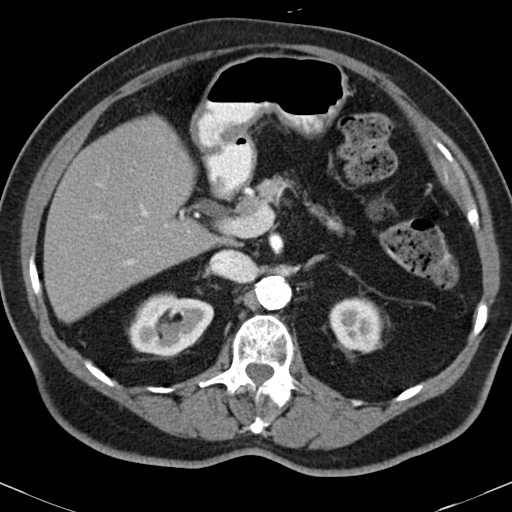
[im 73/93  soft-tissue]
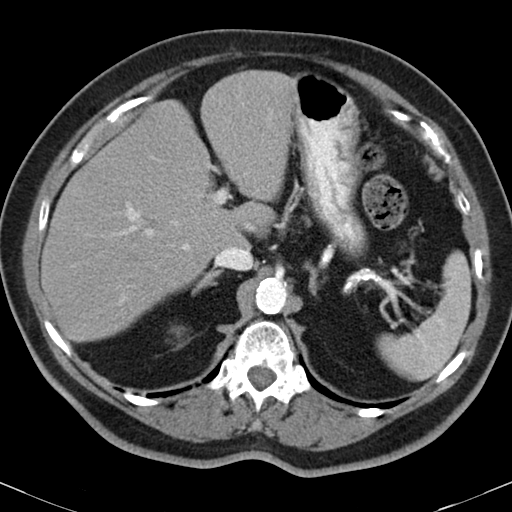
[im 78/93  soft-tissue]
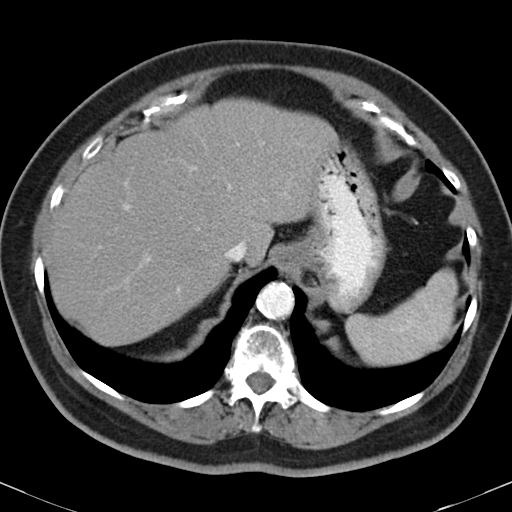
[im 88/93  soft-tissue]
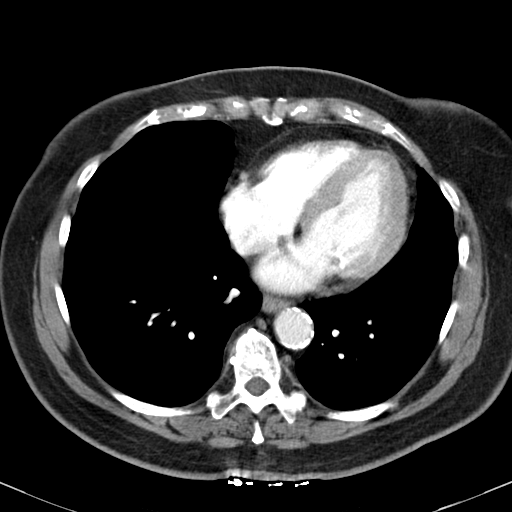

[Series 602: coronal · coronal · 0.90mm/px · 3 of 130 slices shown]
[im 44/130  soft-tissue]
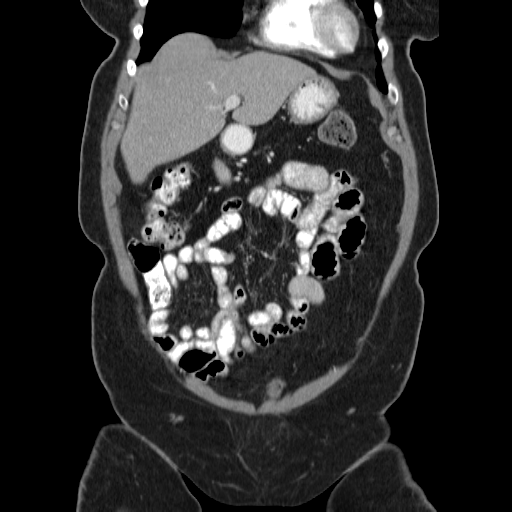
[im 58/130  soft-tissue]
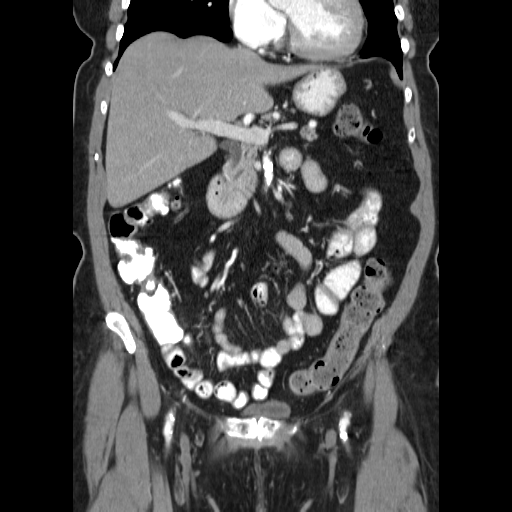
[im 72/130  soft-tissue]
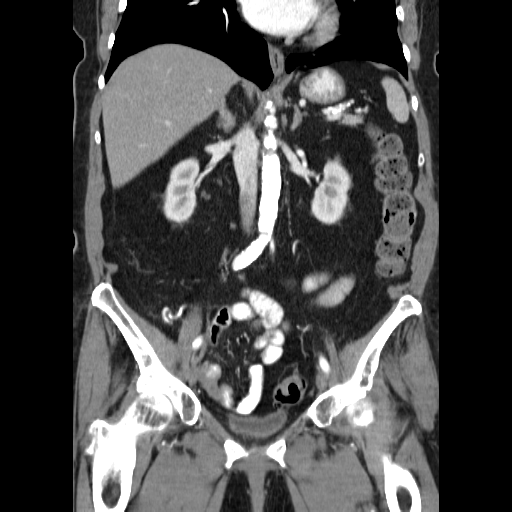

[16 of 46 positions shown; findings below may reference images not displayed]

FINDINGS: LOWER CHEST: Lung bases are clear. Included heart size is mildly
enlarged. No pericardial effusion.

HEPATOBILIARY: The liver is normal.  Status post cholecystectomy.

PANCREAS: Focally atrophic pancreatic body with mildly prominent
less than 3 mm pancreatic duct through this segment. No pseudocyst
or pancreatic mass.

SPLEEN: Normal.

ADRENALS/URINARY TRACT: Kidneys are orthotopic, demonstrating
symmetric enhancement. No nephrolithiasis, hydronephrosis or solid
renal masses. 16 mm RIGHT lower pole exophytic simple cyst. The
unopacified ureters are normal in course and caliber. Delayed
imaging through the kidneys demonstrates symmetric prompt contrast
excretion within the proximal urinary collecting system. Duplicated
RIGHT urinary collecting system seen on the delayed phase to at
least the mid ureter, distal segment not imaged. Urinary bladder is
partially distended with bladder prolapse, sagittal 71/150.
Thickened adrenal glands associated with hyperplasia without focal
nodule.

STOMACH/BOWEL: The stomach, small and large bowel are normal in
course and caliber without inflammatory changes. Moderate colonic
diverticulosis. Mild amount of retained large bowel stool. Normal
appendix.

VASCULAR/LYMPHATIC: Aortoiliac vessels are normal in course and
caliber, severe calcific atherosclerosis. No lymphadenopathy by CT
size criteria.

REPRODUCTIVE: Status post hysterectomy.

OTHER: No intraperitoneal free fluid or free air. Phleboliths in the
pelvis.

MUSCULOSKELETAL: Nonacute. Small fat containing RIGHT ventral
hernia. Moderate LEFT and mild RIGHT hip osteoarthrosis. Grade 1
L4-5 anterolisthesis without spondylolysis. Moderate to severe lower
lumbar facet arthropathy.
IMPRESSION: No acute intra-abdominopelvic process.

Duplicated RIGHT renal collecting system without obstructive
uropathy. Bladder prolapse.

Moderate colonic diverticulosis.

Severe atherosclerosis.

These results will be called to the ordering clinician or
representative by the Radiologist Assistant, and communication
documented in the z[employer] Dashboard

## 2017-07-31 ENCOUNTER — Other Ambulatory Visit: Payer: Self-pay

## 2017-07-31 MED ORDER — ONETOUCH ULTRASOFT LANCETS MISC: 12 refills | Status: AC

## 2017-07-31 MED ORDER — GLUCOSE BLOOD VI STRP: ORAL_STRIP | 12 refills | Status: DC

## 2017-08-04 ENCOUNTER — Ambulatory Visit (INDEPENDENT_AMBULATORY_CARE_PROVIDER_SITE_OTHER): Payer: Medicare Other | Admitting: Family Medicine

## 2017-08-04 ENCOUNTER — Encounter: Payer: Self-pay | Admitting: Family Medicine

## 2017-08-04 VITALS — BP 132/80 | HR 82 | Resp 16 | Ht 63.0 in | Wt 167.0 lb

## 2017-08-04 DIAGNOSIS — M5136 Other intervertebral disc degeneration, lumbar region: Secondary | ICD-10-CM | POA: Insufficient documentation

## 2017-08-04 DIAGNOSIS — E538 Deficiency of other specified B group vitamins: Secondary | ICD-10-CM

## 2017-08-04 DIAGNOSIS — M2560 Stiffness of unspecified joint, not elsewhere classified: Secondary | ICD-10-CM | POA: Insufficient documentation

## 2017-08-04 DIAGNOSIS — M1612 Unilateral primary osteoarthritis, left hip: Secondary | ICD-10-CM

## 2017-08-04 DIAGNOSIS — E559 Vitamin D deficiency, unspecified: Secondary | ICD-10-CM

## 2017-08-04 DIAGNOSIS — M169 Osteoarthritis of hip, unspecified: Secondary | ICD-10-CM | POA: Insufficient documentation

## 2017-08-04 DIAGNOSIS — E1165 Type 2 diabetes mellitus with hyperglycemia: Secondary | ICD-10-CM | POA: Diagnosis not present

## 2017-08-04 DIAGNOSIS — M545 Low back pain, unspecified: Secondary | ICD-10-CM | POA: Insufficient documentation

## 2017-08-04 DIAGNOSIS — M1710 Unilateral primary osteoarthritis, unspecified knee: Secondary | ICD-10-CM | POA: Insufficient documentation

## 2017-08-04 DIAGNOSIS — M179 Osteoarthritis of knee, unspecified: Secondary | ICD-10-CM | POA: Insufficient documentation

## 2017-08-04 DIAGNOSIS — E785 Hyperlipidemia, unspecified: Secondary | ICD-10-CM | POA: Diagnosis not present

## 2017-08-04 DIAGNOSIS — I10 Essential (primary) hypertension: Secondary | ICD-10-CM

## 2017-08-04 DIAGNOSIS — E1140 Type 2 diabetes mellitus with diabetic neuropathy, unspecified: Secondary | ICD-10-CM

## 2017-08-04 DIAGNOSIS — IMO0002 Reserved for concepts with insufficient information to code with codable children: Secondary | ICD-10-CM

## 2017-08-04 NOTE — Progress Notes (Signed)
Date:  08/04/2017   Name:  Betty Ibarra   DOB:  Jun 08, 1941   MRN:  [id number]  PCP:  Betty Potter, Ibarra    Chief Complaint: Diabetes (BS avg after Cortizone Shots 225-240 )   History of Present Illness:  This is a 76 y.o. female seen for five month f/u. BGs well controlled at home. Had cortisone shot L hip in August which helped but caused sugars to run high. Neurologist didn't think had TIA and took off Plavix but kept on asa. Never started Lipitor due to concern re: side effects.   Review of Systems:  Review of Systems  Constitutional: Negative for chills and fever.  Respiratory: Negative for cough and shortness of breath.   Cardiovascular: Negative for chest pain and leg swelling.  Genitourinary: Negative for difficulty urinating.  Neurological: Negative for syncope and light-headedness.    Patient Active Problem List   Diagnosis Date Noted  . Degeneration of lumbar intervertebral disc 08/04/2017  . Low back pain 08/04/2017  . Multiple stiff joints 08/04/2017  . Osteoarthritis of knee 08/04/2017  . Refusal of blood transfusions as patient is Jehovah's Witness 03/04/2017  . Transient neurological symptoms 03/04/2017  . TIA (transient ischemic attack) 02/25/2017  . Gait instability 10/08/2016  . Left flank mass 04/30/2016  . Obesity, Class I, BMI 30-34.9 02/06/2016  . Hiatal hernia 02/06/2016  . Vitamin D deficiency 07/12/2015  . Vitamin B12 deficiency 07/12/2015  . IBS (irritable bowel syndrome) 07/11/2015  . Type 2 diabetes mellitus, uncontrolled, with neuropathy ([place]) 07/06/2015  . Hyperlipidemia 07/06/2015  . Hypertension 07/06/2015  . Colon polyp 07/06/2015  . Colon polyp 07/06/2015  . 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 Presence of intraocular lens 05/24/2015  . Pseudophakia of right eye 05/24/2015  . Combined forms of age-related cataract of left eye 09/13/2014  . Arthralgia of hip 07/25/2014  . Arthritis pain, hip 07/25/2014  .  Combined forms of age-related cataract of both eyes 07/12/2014  . Exudative age-related macular degeneration ([place]) 07/12/2014    Prior to Admission medications   Medication Sig Start Date End Date Taking? Authorizing Provider  aspirin 81 MG chewable tablet Chew 1 tablet by mouth daily. 02/21/17  Yes Provider, Historical, Ibarra  Blood Glucose Monitoring Suppl (FREESTYLE FREEDOM LITE) w/Device KIT Use to check BS TWICE a day. DX Code: DM2: E11.9 06/26/17  Yes Betty Ibarra  Cholecalciferol (VITAMIN D3) 2000 units TABS Take 2 tablets by mouth daily.   Yes Provider, Historical, Ibarra  glipiZIDE (GLUCOTROL) 5 MG tablet Take 1 tablet (5 mg total) by mouth 2 (two) times daily before a meal. 06/12/17  Yes Betty Ibarra, Gwyndolyn Saxon, Ibarra  glucose blood test strip Use to check Blood Sugar twice daily. DX E11.40 07/31/17  Yes Betty Ibarra, Gwyndolyn Saxon, Ibarra  Lancets [facility] ULTRASOFT) lancets Use to check BS twice daily DX: ICD 10 E11.9 07/31/17  Yes Betty Ibarra, Gwyndolyn Saxon, Ibarra  lisinopril (PRINIVIL,ZESTRIL) 20 MG tablet Take 1 tablet (20 mg total) by mouth daily. 01/23/17  Yes Kaidyn Ibarra, Gwyndolyn Saxon, Ibarra  Magnesium 250 MG TABS Take by mouth daily.   Yes Provider, Historical, Ibarra  metFORMIN (GLUCOPHAGE) 1000 MG tablet Take 1 tablet (1,000 mg total) by mouth 2 (two) times daily with a meal. 01/23/17  Yes Betty Ibarra, Gwyndolyn Saxon, Ibarra  Multiple Vitamins-Minerals (MULTIVITAMIN WITH MINERALS) tablet Take 1 tablet by mouth daily.   Yes Provider, Historical, Ibarra  TURMERIC PO Take by mouth.   Yes Provider, Historical, Ibarra  vitamin B-12 (CYANOCOBALAMIN) 1000 MCG tablet Take 0.5 tablets (500  mcg total) by mouth daily. 10/09/16  Yes Betty Ibarra, Gwyndolyn Saxon, Ibarra  zinc gluconate 50 MG tablet Take 50 mg by mouth daily.   Yes Provider, Historical, Ibarra    Allergies  Allergen Reactions  . Sulfa Antibiotics Itching and Rash  . Sulfasalazine Itching and Rash  . Amoxicillin Rash  . Penicillins Rash    Past Surgical History:  Procedure Laterality Date  . CHOLECYSTECTOMY    . COLONOSCOPY   2010, 2015   one polyp  . PARTIAL HYSTERECTOMY     one ovary remains    Social History   Tobacco Use  . Smoking status: Never Smoker  . Smokeless tobacco: Never Used  Substance Use Topics  . Alcohol use: No    Comment: glass wine every 3-6 months  . Drug use: No    Family History  Problem Relation Age of Onset  . Colon cancer Mother   . Diabetes Father   . Diabetes Maternal Grandmother   . Diabetes Paternal Grandmother     Medication list has been reviewed and updated.  Physical Examination: BP 132/80   Pulse 82   Resp 16   Ht _0  (1.6 m)   Wt 167 lb (75.8 kg)   SpO2 98%   BMI 29.58 kg/m   Physical Exam  Constitutional: She appears well-developed and well-nourished.  Cardiovascular: Normal rate, regular rhythm and normal heart sounds.  Pulmonary/Chest: Effort normal and breath sounds normal.  Musculoskeletal: She exhibits no edema.  Neurological: She is alert.  Skin: Skin is warm and dry.  Psychiatric: She has a normal mood and affect. Her behavior is normal.  Nursing note and vitals reviewed.   Assessment and Plan:  1. Type 2 diabetes mellitus, uncontrolled, with neuropathy (HCC) Unclear control on glipizide/metformin, not taking regularly - HgB A1c - Urine Microalbumin w/creat. ratio  2. Essential hypertension Improved control on increased lisinopril - Basic Metabolic Panel (BMET) - Magnesium  3. Vitamin B12 deficiency On decreased supplement - B12  4. Primary osteoarthritis of left hip S/p cortisone shot, ortho following  5. Hyperlipidemia, unspecified hyperlipidemia type Unwilling to take statin  6. Vitamin D deficiency Well controlled on supplement  Return in about 3 months (around 11/04/2017).  Betty Ibarra. Betty Ibarra, [facility]  08/04/2017

## 2017-08-05 ENCOUNTER — Other Ambulatory Visit: Payer: Self-pay | Admitting: Family Medicine

## 2017-08-05 DIAGNOSIS — I10 Essential (primary) hypertension: Secondary | ICD-10-CM

## 2017-08-05 LAB — BASIC METABOLIC PANEL
BUN/Creatinine Ratio: 17 (ref 12–28)
BUN: 17 mg/dL (ref 8–27)
CALCIUM: 9.5 mg/dL (ref 8.7–10.3)
CO2: 24 mmol/L (ref 20–29)
Chloride: 93 mmol/L — ABNORMAL LOW (ref 96–106)
Creatinine, Ser: 1.02 mg/dL — ABNORMAL HIGH (ref 0.57–1.00)
GFR, EST AFRICAN AMERICAN: 61 mL/min/{1.73_m2} (ref >59)
GFR, EST NON AFRICAN AMERICAN: 53 mL/min/{1.73_m2} — AB (ref >59)
Glucose: 164 mg/dL — ABNORMAL HIGH (ref 65–99)
Potassium: 5.1 mmol/L (ref 3.5–5.2)
Sodium: 134 mmol/L (ref 134–144)

## 2017-08-05 LAB — HEMOGLOBIN A1C
Est. average glucose Bld gHb Est-mCnc: 255 mg/dL
Hgb A1c MFr Bld: 10.5 % — ABNORMAL HIGH (ref 4.8–5.6)

## 2017-08-05 LAB — MICROALBUMIN / CREATININE URINE RATIO
CREATININE, UR: 192.3 mg/dL
MICROALB/CREAT RATIO: 43.1 mg/g{creat} — AB (ref 0.0–30.0)
MICROALBUM., U, RANDOM: 82.9 ug/mL

## 2017-08-05 LAB — MAGNESIUM: MAGNESIUM: 1.9 mg/dL (ref 1.6–2.3)

## 2017-08-05 LAB — VITAMIN B12: Vitamin B-12: 578 pg/mL (ref 232–1245)

## 2017-08-05 MED ORDER — LISINOPRIL 40 MG PO TABS: 40.0000 mg | ORAL_TABLET | Freq: Every day | ORAL | 2 refills | Status: DC

## 2017-08-05 MED ORDER — GLIPIZIDE 10 MG PO TABS: 10.0000 mg | ORAL_TABLET | Freq: Two times a day (BID) | ORAL | 2 refills | Status: DC

## 2017-08-18 ENCOUNTER — Telehealth: Payer: Self-pay

## 2017-08-18 NOTE — Telephone Encounter (Signed)
Patient LM stating she does not want to change BP meds again as she just had gone up before this.

## 2017-08-18 NOTE — Telephone Encounter (Signed)
OK will discuss next visit

## 2017-08-19 NOTE — Telephone Encounter (Signed)
Please change in system and send refill for regular MG she was on so they do not mess it up.

## 2017-08-20 ENCOUNTER — Other Ambulatory Visit: Payer: Self-pay | Admitting: Family Medicine

## 2017-08-20 DIAGNOSIS — I10 Essential (primary) hypertension: Secondary | ICD-10-CM

## 2017-08-20 MED ORDER — LISINOPRIL 20 MG PO TABS: 20.0000 mg | ORAL_TABLET | Freq: Every day | ORAL | 3 refills | Status: DC

## 2017-08-20 NOTE — Telephone Encounter (Signed)
Lisinopril 20 mg rx sent

## 2017-11-09 ENCOUNTER — Ambulatory Visit (INDEPENDENT_AMBULATORY_CARE_PROVIDER_SITE_OTHER): Payer: Medicare Other | Admitting: Family Medicine

## 2017-11-09 ENCOUNTER — Encounter: Payer: Self-pay | Admitting: Family Medicine

## 2017-11-09 VITALS — BP 140/78 | HR 74 | Resp 16 | Ht 63.0 in | Wt 166.0 lb

## 2017-11-09 DIAGNOSIS — N183 Chronic kidney disease, stage 3 unspecified: Secondary | ICD-10-CM

## 2017-11-09 DIAGNOSIS — I10 Essential (primary) hypertension: Secondary | ICD-10-CM | POA: Diagnosis not present

## 2017-11-09 DIAGNOSIS — E538 Deficiency of other specified B group vitamins: Secondary | ICD-10-CM

## 2017-11-09 DIAGNOSIS — E1140 Type 2 diabetes mellitus with diabetic neuropathy, unspecified: Secondary | ICD-10-CM

## 2017-11-09 DIAGNOSIS — E559 Vitamin D deficiency, unspecified: Secondary | ICD-10-CM

## 2017-11-09 DIAGNOSIS — M1612 Unilateral primary osteoarthritis, left hip: Secondary | ICD-10-CM

## 2017-11-09 DIAGNOSIS — IMO0002 Reserved for concepts with insufficient information to code with codable children: Secondary | ICD-10-CM

## 2017-11-09 DIAGNOSIS — E1165 Type 2 diabetes mellitus with hyperglycemia: Secondary | ICD-10-CM | POA: Diagnosis not present

## 2017-11-09 DIAGNOSIS — G459 Transient cerebral ischemic attack, unspecified: Secondary | ICD-10-CM

## 2017-11-09 DIAGNOSIS — E1122 Type 2 diabetes mellitus with diabetic chronic kidney disease: Secondary | ICD-10-CM | POA: Insufficient documentation

## 2017-11-09 DIAGNOSIS — E785 Hyperlipidemia, unspecified: Secondary | ICD-10-CM | POA: Diagnosis not present

## 2017-11-09 MED ORDER — GLIPIZIDE 5 MG PO TABS: 5.0000 mg | ORAL_TABLET | Freq: Two times a day (BID) | ORAL | Status: DC

## 2017-11-09 NOTE — Progress Notes (Signed)
Date:  11/09/2017   Name:  Betty Ibarra   DOB:  03/29/1941   MRN:  [id number]  PCP:  Adline Potter, MD    Chief Complaint: Diabetes (bs range 150-250) and Cough   History of Present Illness:  This is a 77 y.o. female seen for three month f/u. URI sxs past week, getting better. Never increased glipizide or lisinopril doses due to pharmacy confusion and hearing pulse while sleeping on increased lisinopril. Home BGs in 100s in am. Neuropathic sxs come and go. Hip pain comes and goes, better today. Remains on asa for possible TIA per neuro.  Review of Systems:  Review of Systems  Constitutional: Negative for chills and fever.  Respiratory: Negative for shortness of breath.   Cardiovascular: Negative for chest pain and leg swelling.  Genitourinary: Negative for difficulty urinating.  Neurological: Negative for syncope and light-headedness.    Patient Active Problem List   Diagnosis Date Noted  . CKD (chronic kidney disease) stage 3, GFR 30-59 ml/min (HCC) 11/09/2017  . Degeneration of lumbar intervertebral disc 08/04/2017  . Osteoarthritis of knee 08/04/2017  . Osteoarthritis of hip 08/04/2017  . Refusal of blood transfusions as patient is Jehovah's Witness 03/04/2017  . TIA (transient ischemic attack) 02/25/2017  . Gait instability 10/08/2016  . Obesity, Class I, BMI 30-34.9 02/06/2016  . Hiatal hernia 02/06/2016  . Vitamin D deficiency 07/12/2015  . Vitamin B12 deficiency 07/12/2015  . IBS (irritable bowel syndrome) 07/11/2015  . Type 2 diabetes mellitus, uncontrolled, with neuropathy ([place]) 07/06/2015  . Hyperlipidemia 07/06/2015  . Hypertension 07/06/2015  . Colon polyp 07/06/2015  . Colon polyp 07/06/2015  . 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 Presence of intraocular lens 05/24/2015  . Pseudophakia of right eye 05/24/2015  . Combined forms of age-related cataract of left eye 09/13/2014  . Arthralgia of hip 07/25/2014  . Combined forms of  age-related cataract of both eyes 07/12/2014  . Exudative age-related macular degeneration ([place]) 07/12/2014    Prior to Admission medications   Medication Sig Start Date End Date Taking? Authorizing Provider  aspirin 81 MG chewable tablet Chew 1 tablet by mouth daily. 02/21/17  Yes [provider]  Blood Glucose Monitoring Suppl (FREESTYLE FREEDOM LITE) w/Device KIT Use to check BS TWICE a day. DX Code: DM2: E11.9 06/26/17  Yes Manish Ruggiero, Gwyndolyn Saxon, MD  Cholecalciferol (VITAMIN D3) 2000 units TABS Take 2 tablets by mouth daily.   Yes [provider]  glipiZIDE (GLUCOTROL) 5 MG tablet Take 1 tablet (5 mg total) by mouth 2 (two) times daily before a meal. 11/09/17  Yes Edana Aguado, MD  glucose blood test strip Use to check Blood Sugar twice daily. DX E11.40 07/31/17  Yes Arland Usery, Gwyndolyn Saxon, MD  Lancets [facility] ULTRASOFT) lancets Use to check BS twice daily DX: ICD 10 E11.9 07/31/17  Yes Promiss Labarbera, Gwyndolyn Saxon, MD  lisinopril (PRINIVIL,ZESTRIL) 20 MG tablet Take 1 tablet (20 mg total) by mouth daily. 08/20/17  Yes Trishia Cuthrell, Gwyndolyn Saxon, MD  Magnesium 250 MG TABS Take by mouth daily.   Yes [provider]  metFORMIN (GLUCOPHAGE) 1000 MG tablet Take 1 tablet (1,000 mg total) by mouth 2 (two) times daily with a meal. 01/23/17  Yes Dameer Speiser, Gwyndolyn Saxon, MD  Multiple Vitamins-Minerals (MULTIVITAMIN WITH MINERALS) tablet Take 1 tablet by mouth daily.   Yes [provider]  TURMERIC PO Take by mouth.   Yes [provider]  vitamin B-12 (CYANOCOBALAMIN) 1000 MCG tablet Take 0.5 tablets (500 mcg total) by mouth daily. 10/09/16  Yes  Lekita Kerekes, MD  zinc gluconate 50 MG tablet Take 50 mg by mouth daily.   Yes [provider]    Allergies  Allergen Reactions  . Sulfa Antibiotics Itching and Rash  . Sulfasalazine Itching and Rash  . Amoxicillin Rash  . Penicillins Rash    Past Surgical History:  Procedure Laterality Date  . CHOLECYSTECTOMY    . COLONOSCOPY  2010, 2015   one  polyp  . PARTIAL HYSTERECTOMY     one ovary remains    Social History   Tobacco Use  . Smoking status: Never Smoker  . Smokeless tobacco: Never Used  Substance Use Topics  . Alcohol use: No    Comment: glass wine every 3-6 months  . Drug use: No    Family History  Problem Relation Age of Onset  . Colon cancer Mother   . Diabetes Father   . Diabetes Maternal Grandmother   . Diabetes Paternal Grandmother     Medication list has been reviewed and updated.  Physical Examination: BP 140/78   Pulse 74   Resp 16   Ht 5' 3"  (1.6 m)   Wt 166 lb (75.3 kg)   BMI 29.41 kg/m   Physical Exam  Constitutional: She appears well-developed and well-nourished.  Cardiovascular: Normal rate, regular rhythm and normal heart sounds.  Pulmonary/Chest: Effort normal and breath sounds normal.  Musculoskeletal: She exhibits no edema.  Neurological: She is alert.  Skin: Skin is warm and dry.  Psychiatric: She has a normal mood and affect. Her behavior is normal.  Nursing note and vitals reviewed.   Assessment and Plan:  1. Type 2 diabetes mellitus, uncontrolled, with neuropathy (HCC) Unclear control on glipizide/metformin - HgB A1c - Urine Microalbumin w/creat. ratio  2. CKD (chronic kidney disease) stage 3, GFR 30-59 ml/min (HCC) eGFR 53 last visit - Basic Metabolic Panel (BMET) - CBC  3. Essential hypertension Marginal control on lisinopril, consider increased dose or add amlodipine  4. TIA (transient ischemic attack) Cont asa per neuro  5. Primary osteoarthritis of left hip Improved s/p cortisone injection, ortho following  6. Hyperlipidemia, unspecified hyperlipidemia type Declines statin  7. Vitamin D deficiency Well controlled on supplement - Vitamin D (25 hydroxy)  8. Vitamin B12 deficiency Well controlled on supplement  9. Med review Discussed unclear benefit of zinc/turmeric/Mg supplements, pt wishes to continue  10. HM Consider pneumo/zoster imms next  visit  Return in about 3 months (around 02/06/2018).  Satira Anis. [facility]  11/09/2017

## 2017-11-10 LAB — BASIC METABOLIC PANEL
BUN / CREAT RATIO: 20 (ref 12–28)
BUN: 21 mg/dL (ref 8–27)
CO2: 21 mmol/L (ref 20–29)
CREATININE: 1.07 mg/dL — AB (ref 0.57–1.00)
Calcium: 10 mg/dL (ref 8.7–10.3)
Chloride: 93 mmol/L — ABNORMAL LOW (ref 96–106)
GFR, EST AFRICAN AMERICAN: 57 mL/min/{1.73_m2} — AB (ref >59)
GFR, EST NON AFRICAN AMERICAN: 50 mL/min/{1.73_m2} — AB (ref >59)
Glucose: 260 mg/dL — ABNORMAL HIGH (ref 65–99)
Potassium: 4.8 mmol/L (ref 3.5–5.2)
SODIUM: 135 mmol/L (ref 134–144)

## 2017-11-10 LAB — CBC
HEMATOCRIT: 40.7 % (ref 34.0–46.6)
Hemoglobin: 13.3 g/dL (ref 11.1–15.9)
MCH: 28.5 pg (ref 26.6–33.0)
MCHC: 32.7 g/dL (ref 31.5–35.7)
MCV: 87 fL (ref 79–97)
PLATELETS: 367 10*3/uL (ref 150–379)
RBC: 4.67 x10E6/uL (ref 3.77–5.28)
RDW: 14.2 % (ref 12.3–15.4)
WBC: 8 10*3/uL (ref 3.4–10.8)

## 2017-11-10 LAB — MICROALBUMIN / CREATININE URINE RATIO
CREATININE, UR: 128.7 mg/dL
MICROALBUM., U, RANDOM: 25.7 ug/mL
Microalb/Creat Ratio: 20 mg/g creat (ref 0.0–30.0)

## 2017-11-10 LAB — HEMOGLOBIN A1C
ESTIMATED AVERAGE GLUCOSE: 246 mg/dL
HEMOGLOBIN A1C: 10.2 % — AB (ref 4.8–5.6)

## 2017-11-10 LAB — VITAMIN D 25 HYDROXY (VIT D DEFICIENCY, FRACTURES): Vit D, 25-Hydroxy: 26.9 ng/mL — ABNORMAL LOW (ref 30.0–100.0)

## 2017-11-11 ENCOUNTER — Other Ambulatory Visit: Payer: Self-pay | Admitting: Family Medicine

## 2017-11-11 MED ORDER — PIOGLITAZONE HCL 15 MG PO TABS: 15.0000 mg | ORAL_TABLET | Freq: Every day | ORAL | 2 refills | Status: DC

## 2017-11-11 MED ORDER — VITAMIN D3 50 MCG (2000 UT) PO TABS: 1.0000 | ORAL_TABLET | Freq: Every day | ORAL | Status: AC

## 2017-12-01 ENCOUNTER — Telehealth: Payer: Self-pay

## 2017-12-01 NOTE — Telephone Encounter (Signed)
Did not take Actos and still wants to be seen for 30 day f/u/ Also wants to go over kidney function as she feels her kidneys and protein  started being issue after CT.

## 2017-12-01 NOTE — Telephone Encounter (Signed)
OK with not taking Actos as a1c improving. Has appt 12/10/17, can recheck BP and kidney function then. Aspirin can lower kidney function but neurologist recommend she stay on for possible TIA.

## 2017-12-10 ENCOUNTER — Other Ambulatory Visit: Payer: Self-pay

## 2017-12-10 ENCOUNTER — Ambulatory Visit (INDEPENDENT_AMBULATORY_CARE_PROVIDER_SITE_OTHER): Payer: Medicare Other | Admitting: Family Medicine

## 2017-12-10 ENCOUNTER — Encounter: Payer: Self-pay | Admitting: Family Medicine

## 2017-12-10 VITALS — BP 114/78 | HR 66 | Resp 16 | Ht 63.0 in | Wt 160.0 lb

## 2017-12-10 DIAGNOSIS — N183 Chronic kidney disease, stage 3 (moderate): Secondary | ICD-10-CM | POA: Diagnosis not present

## 2017-12-10 DIAGNOSIS — H353190 Nonexudative age-related macular degeneration, unspecified eye, stage unspecified: Secondary | ICD-10-CM

## 2017-12-10 DIAGNOSIS — G459 Transient cerebral ischemic attack, unspecified: Secondary | ICD-10-CM

## 2017-12-10 DIAGNOSIS — H6123 Impacted cerumen, bilateral: Secondary | ICD-10-CM

## 2017-12-10 DIAGNOSIS — I10 Essential (primary) hypertension: Secondary | ICD-10-CM | POA: Diagnosis not present

## 2017-12-10 DIAGNOSIS — E663 Overweight: Secondary | ICD-10-CM | POA: Diagnosis not present

## 2017-12-10 DIAGNOSIS — E1122 Type 2 diabetes mellitus with diabetic chronic kidney disease: Secondary | ICD-10-CM

## 2017-12-10 DIAGNOSIS — E1165 Type 2 diabetes mellitus with hyperglycemia: Secondary | ICD-10-CM

## 2017-12-10 DIAGNOSIS — IMO0002 Reserved for concepts with insufficient information to code with codable children: Secondary | ICD-10-CM

## 2017-12-10 DIAGNOSIS — H6120 Impacted cerumen, unspecified ear: Secondary | ICD-10-CM | POA: Insufficient documentation

## 2017-12-10 DIAGNOSIS — Z0000 Encounter for general adult medical examination without abnormal findings: Secondary | ICD-10-CM | POA: Diagnosis not present

## 2017-12-10 DIAGNOSIS — Z8619 Personal history of other infectious and parasitic diseases: Secondary | ICD-10-CM | POA: Insufficient documentation

## 2017-12-10 DIAGNOSIS — E1140 Type 2 diabetes mellitus with diabetic neuropathy, unspecified: Secondary | ICD-10-CM

## 2017-12-10 DIAGNOSIS — E538 Deficiency of other specified B group vitamins: Secondary | ICD-10-CM | POA: Diagnosis not present

## 2017-12-10 DIAGNOSIS — E559 Vitamin D deficiency, unspecified: Secondary | ICD-10-CM

## 2017-12-10 DIAGNOSIS — E785 Hyperlipidemia, unspecified: Secondary | ICD-10-CM

## 2017-12-10 MED ORDER — GLIPIZIDE 5 MG PO TABS: 5.0000 mg | ORAL_TABLET | Freq: Every day | ORAL | Status: DC

## 2017-12-10 MED ORDER — GLUCOSE BLOOD VI STRP: ORAL_STRIP | 12 refills | Status: DC

## 2017-12-10 NOTE — Progress Notes (Signed)
Date:  12/10/2017   Name:  Betty Ibarra   DOB:  04/03/1941   MRN:  [id number]  PCP:  Plonk, William, MD    Chief Complaint: Diabetes (Stopped salt and alos BS has been lower so she went down on Metformin to 500 mg after dinner on 11/27/2017. Ranges 89-125)   History of Present Illness:  This is a 77 y.o. female seen for one month f/u. A1c 10.2% last visit, never started Actos, has been dieting and cutting down on salt. AM BGs in 90s so decreased metformin to 1000/500, still on glipizide 5 mg bid. Restarted vit D 2000 units daily. Concerned re: declining kidney function but declines nephro referral, wants recheck today. Also declines statin. Hx shingles, unsure about zoster or pneumo imms. Hx AMD, last ophtho visit last year.C/o B hearing loss.  Review of Systems:  Review of Systems  Constitutional: Negative for chills and fever.  Eyes: Negative for pain.  Respiratory: Negative for cough and shortness of breath.   Cardiovascular: Negative for chest pain and leg swelling.  Genitourinary: Negative for difficulty urinating.  Neurological: Negative for syncope and light-headedness.    Patient Active Problem List   Diagnosis Date Noted  . Cerumen impaction 12/10/2017  . CKD stage 3 due to type 2 diabetes mellitus (HCC) 11/09/2017  . Degeneration of lumbar intervertebral disc 08/04/2017  . Osteoarthritis of knee 08/04/2017  . Osteoarthritis of hip 08/04/2017  . Refusal of blood transfusions as patient is Jehovah's Witness 03/04/2017  . TIA (transient ischemic attack) 02/25/2017  . Gait instability 10/08/2016  . Overweight (BMI 25.0-29.9) 02/06/2016  . Hiatal hernia 02/06/2016  . Vitamin D deficiency 07/12/2015  . Vitamin B12 deficiency 07/12/2015  . IBS (irritable bowel syndrome) 07/11/2015  . Type 2 diabetes mellitus, uncontrolled, with neuropathy (HCC) 07/06/2015  . Hyperlipidemia 07/06/2015  . Hypertension 07/06/2015  . Colon polyp 07/06/2015  . Colon polyp 07/06/2015  .  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 Presence of intraocular lens 05/24/2015  . Pseudophakia of right eye 05/24/2015  . Combined forms of age-related cataract of left eye 09/13/2014  . Arthralgia of hip 07/25/2014  . Combined forms of age-related cataract of both eyes 07/12/2014  . 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 Authorizing Provider  aspirin 81 MG chewable tablet Chew 1 tablet by mouth daily. 02/21/17  Yes [provider]  Blood Glucose Monitoring Suppl (FREESTYLE FREEDOM LITE) w/Device KIT Use to check BS TWICE a day. DX Code: DM2: E11.9 06/26/17  Yes Plonk, William, MD  Cholecalciferol (VITAMIN D3) 2000 units TABS Take 1 tablet by mouth daily. 11/11/17  Yes Plonk, William, MD  glipiZIDE (GLUCOTROL) 5 MG tablet Take 1 tablet (5 mg total) by mouth daily before breakfast. 12/10/17  Yes Plonk, William, MD  Lancets (ONETOUCH ULTRASOFT) lancets Use to check BS twice daily DX: ICD 10 E11.9 07/31/17  Yes Plonk, William, MD  lisinopril (PRINIVIL,ZESTRIL) 20 MG tablet Take 1 tablet (20 mg total) by mouth daily. 08/20/17  Yes Plonk, William, MD  Magnesium 250 MG TABS Take by mouth daily.   Yes [provider]  metFORMIN (GLUCOPHAGE) 1000 MG tablet Take 1 tablet (1,000 mg total) by mouth 2 (two) times daily with a meal. Patient taking differently: Take 500 mg by mouth daily after supper.  01/23/17  Yes Plonk, William, MD  Multiple Vitamins-Minerals (MULTIVITAMIN WITH MINERALS) tablet Take 1 tablet by mouth daily.     Yes [provider]  TURMERIC PO Take by mouth.   Yes [provider]  vitamin B-12 (CYANOCOBALAMIN) 1000 MCG tablet Take 0.5 tablets (500 mcg total) by mouth daily. 10/09/16  Yes Plonk, William, MD  glucose blood test strip Use to check Blood Sugar twice daily. DX E11.40 12/10/17   Plonk, William, MD    Allergies  Allergen  Reactions  . Sulfa Antibiotics Itching and Rash  . Sulfasalazine Itching and Rash  . Amoxicillin Rash  . Penicillins Rash    Past Surgical History:  Procedure Laterality Date  . CHOLECYSTECTOMY    . COLONOSCOPY  2010, 2015   one polyp  . PARTIAL HYSTERECTOMY     one ovary remains    Social History   Tobacco Use  . Smoking status: Never Smoker  . Smokeless tobacco: Never Used  Substance Use Topics  . Alcohol use: No    Comment: glass wine every 3-6 months  . Drug use: No    Family History  Problem Relation Age of Onset  . Colon cancer Mother   . Diabetes Father   . Diabetes Maternal Grandmother   . Diabetes Paternal Grandmother     Medication list has been reviewed and updated.  Physical Examination: BP 114/78   Pulse 66   Resp 16   Ht 5' 3" (1.6 m)   Wt 160 lb (72.6 kg)   SpO2 99%   BMI 28.34 kg/m   Physical Exam  Constitutional: She appears well-developed and well-nourished.  HENT:  B cerumen impactions  Cardiovascular: Normal rate, regular rhythm and normal heart sounds.  Pulmonary/Chest: Effort normal and breath sounds normal.  Musculoskeletal: She exhibits no edema.  Neurological: She is alert.  Skin: Skin is warm and dry.  Psychiatric: She has a normal mood and affect. Her behavior is normal.  Nursing note and vitals reviewed.   Assessment and Plan:  1. Type 2 diabetes mellitus, uncontrolled, with neuropathy (HCC) Improved with weight loss, may decrease glipizide to 5 mg qam to avoid hypoglycemia, restart metformin 1000 mg bid, consider Jardiance in future - HgB A1c  2. CKD stage 3 due to type 2 diabetes mellitus (HCC) Labs reviewed, MCR ok, cont lisinopril, declines nephro referral - Basic Metabolic Panel (BMET)  3. Essential hypertension Well controlled on lisinopril  4. Hyperlipidemia, unspecified hyperlipidemia type Declines statin  5. TIA (transient ischemic attack) Cont asa  6. Nonexudative age-related macular degeneration,  unspecified laterality, unspecified stage Pt to schedule ophtho f/u  7. Bilateral impacted cerumen Flushed to clear bilaterally  8. Vitamin D deficiency Back on supplement - Vitamin D (25 hydroxy)  9. Vitamin B12 deficiency Well controlled on supplement  10. Overweight (BMI 25.0-29.9) Weight down 6#, encouraged continued exercise/weight loss  11. Healthcare maintenance Discussed pneumo/zoster imms, wants to wait  Return in about 2 months (around 02/09/2018).  William M. Plonk, Jr. MD [facility]  12/10/2017  

## 2017-12-11 ENCOUNTER — Other Ambulatory Visit: Payer: Self-pay

## 2017-12-11 LAB — BASIC METABOLIC PANEL
BUN / CREAT RATIO: 21 (ref 12–28)
BUN: 21 mg/dL (ref 8–27)
CALCIUM: 9.8 mg/dL (ref 8.7–10.3)
CHLORIDE: 95 mmol/L — AB (ref 96–106)
CO2: 25 mmol/L (ref 20–29)
Creatinine, Ser: 0.98 mg/dL (ref 0.57–1.00)
GFR, EST AFRICAN AMERICAN: 64 mL/min/{1.73_m2} (ref >59)
GFR, EST NON AFRICAN AMERICAN: 55 mL/min/{1.73_m2} — AB (ref >59)
Glucose: 99 mg/dL (ref 65–99)
POTASSIUM: 5.7 mmol/L — AB (ref 3.5–5.2)
SODIUM: 134 mmol/L (ref 134–144)

## 2017-12-11 LAB — HEMOGLOBIN A1C
ESTIMATED AVERAGE GLUCOSE: 209 mg/dL
HEMOGLOBIN A1C: 8.9 % — AB (ref 4.8–5.6)

## 2017-12-11 LAB — VITAMIN D 25 HYDROXY (VIT D DEFICIENCY, FRACTURES): Vit D, 25-Hydroxy: 40.6 ng/mL (ref 30.0–100.0)

## 2017-12-11 MED ORDER — GLUCOSE BLOOD VI STRP: ORAL_STRIP | 12 refills | Status: AC

## 2018-01-20 DIAGNOSIS — L821 Other seborrheic keratosis: Secondary | ICD-10-CM | POA: Diagnosis not present

## 2018-01-20 DIAGNOSIS — L308 Other specified dermatitis: Secondary | ICD-10-CM | POA: Diagnosis not present

## 2018-01-20 DIAGNOSIS — Z08 Encounter for follow-up examination after completed treatment for malignant neoplasm: Secondary | ICD-10-CM | POA: Diagnosis not present

## 2018-01-20 DIAGNOSIS — Z85828 Personal history of other malignant neoplasm of skin: Secondary | ICD-10-CM | POA: Diagnosis not present

## 2018-01-20 DIAGNOSIS — L720 Epidermal cyst: Secondary | ICD-10-CM | POA: Diagnosis not present

## 2018-01-25 ENCOUNTER — Telehealth: Payer: Self-pay

## 2018-01-25 NOTE — Telephone Encounter (Signed)
Called pt to sched AWV w/ NHA. LVM requesting returned call.  

## 2018-02-09 ENCOUNTER — Ambulatory Visit: Payer: Medicare Other | Admitting: Family Medicine

## 2018-02-12 ENCOUNTER — Telehealth: Payer: Self-pay | Admitting: Family Medicine

## 2018-02-12 NOTE — Telephone Encounter (Signed)
Called to schedule Medicare Annual Wellness Visit with Nurse Health Advisor. If patient returns call, please schedule AWV - I with NHA any date Thank you! For any questions please contact: Jill Alexanders [phone]  Skype [email or web address]

## 2018-02-24 ENCOUNTER — Other Ambulatory Visit: Payer: Self-pay | Admitting: Family Medicine

## 2018-02-24 DIAGNOSIS — I10 Essential (primary) hypertension: Secondary | ICD-10-CM

## 2018-03-03 ENCOUNTER — Other Ambulatory Visit: Payer: Self-pay | Admitting: Family Medicine

## 2018-03-08 ENCOUNTER — Ambulatory Visit: Payer: Medicare Other

## 2018-03-08 ENCOUNTER — Ambulatory Visit: Payer: Medicare Other | Admitting: Family Medicine

## 2018-03-11 ENCOUNTER — Ambulatory Visit: Payer: Medicare Other | Admitting: Family Medicine

## 2018-04-12 ENCOUNTER — Ambulatory Visit (INDEPENDENT_AMBULATORY_CARE_PROVIDER_SITE_OTHER): Payer: Medicare Other | Admitting: Family Medicine

## 2018-04-12 ENCOUNTER — Encounter: Payer: Self-pay | Admitting: Family Medicine

## 2018-04-12 ENCOUNTER — Ambulatory Visit (INDEPENDENT_AMBULATORY_CARE_PROVIDER_SITE_OTHER): Payer: Medicare Other

## 2018-04-12 VITALS — BP 142/76 | HR 68 | Ht 63.0 in | Wt 163.0 lb

## 2018-04-12 VITALS — BP 172/68 | HR 70 | Temp 98.3°F | Resp 12 | Ht 63.0 in | Wt 163.4 lb

## 2018-04-12 DIAGNOSIS — Z0000 Encounter for general adult medical examination without abnormal findings: Secondary | ICD-10-CM

## 2018-04-12 DIAGNOSIS — E785 Hyperlipidemia, unspecified: Secondary | ICD-10-CM

## 2018-04-12 DIAGNOSIS — Z9181 History of falling: Secondary | ICD-10-CM | POA: Diagnosis not present

## 2018-04-12 DIAGNOSIS — E119 Type 2 diabetes mellitus without complications: Secondary | ICD-10-CM | POA: Diagnosis not present

## 2018-04-12 DIAGNOSIS — IMO0002 Reserved for concepts with insufficient information to code with codable children: Secondary | ICD-10-CM

## 2018-04-12 DIAGNOSIS — Z598 Other problems related to housing and economic circumstances: Secondary | ICD-10-CM

## 2018-04-12 DIAGNOSIS — I10 Essential (primary) hypertension: Secondary | ICD-10-CM | POA: Diagnosis not present

## 2018-04-12 DIAGNOSIS — E1165 Type 2 diabetes mellitus with hyperglycemia: Secondary | ICD-10-CM | POA: Diagnosis not present

## 2018-04-12 DIAGNOSIS — Z599 Problem related to housing and economic circumstances, unspecified: Secondary | ICD-10-CM

## 2018-04-12 DIAGNOSIS — N183 Chronic kidney disease, stage 3 (moderate): Secondary | ICD-10-CM

## 2018-04-12 DIAGNOSIS — E1140 Type 2 diabetes mellitus with diabetic neuropathy, unspecified: Secondary | ICD-10-CM

## 2018-04-12 DIAGNOSIS — E1122 Type 2 diabetes mellitus with diabetic chronic kidney disease: Secondary | ICD-10-CM

## 2018-04-12 MED ORDER — METFORMIN HCL 1000 MG PO TABS: 1000.0000 mg | ORAL_TABLET | Freq: Two times a day (BID) | ORAL | 1 refills | Status: AC

## 2018-04-12 MED ORDER — GLIPIZIDE 5 MG PO TABS: 5.0000 mg | ORAL_TABLET | Freq: Every day | ORAL | Status: AC

## 2018-04-12 MED ORDER — LISINOPRIL 20 MG PO TABS: 20.0000 mg | ORAL_TABLET | Freq: Every day | ORAL | 1 refills | Status: DC

## 2018-04-12 NOTE — Progress Notes (Signed)
Subjective:   Betty Ibarra is a 77 y.o. female who presents for Medicare Annual (Subsequent) preventive examination.  Review of Systems:  N/A Cardiac Risk Factors include: advanced age (>34mn, >>55women);diabetes mellitus;dyslipidemia;hypertension;sedentary lifestyle     Objective:     Vitals: BP (!) 172/68 (BP Location: Right Arm, Patient Position: Sitting, Cuff Size: Normal)   Pulse 70   Temp 98.3 F (36.8 C) (Oral)   Resp 12   Ht _0  (1.6 m)   Wt 163 lb 6.4 oz (74.1 kg)   SpO2 95%   BMI 28.95 kg/m   Body mass index is 28.95 kg/m.   Denies any dyspnea, chest pain/pressure, headaches, dizziness. Has not taken her antihypertensive today. States she only takes her medications at night. Pt scheduled to be seen by Dr. JRonnald Ramptoday for further evaluation.  Advanced Directives 04/12/2018 12/09/2016 11/06/2016 12/07/2015 12/07/2015 07/11/2015 07/11/2015  Does Patient Have a Medical Advance Directive? Yes Yes Yes Yes No - Yes  Type of [place] (pink MOST or yellow form) Living will  Does patient want to make changes to medical advance directive? - - Yes (MAU/Ambulatory/Procedural Areas - Information given) - - - -  Copy of HErmain Chart? Yes - - - - - Yes  Pre-existing out of facility DNR order (yellow form or pink MOST form) - - - - - Yellow form placed in chart (order not valid for inpatient use) -    Tobacco Social History   Tobacco Use  Smoking Status Never Smoker  Smokeless Tobacco Never Used  Tobacco Comment   smoking cessation materials not required     Counseling given: No Comment: smoking cessation materials not required  Clinical Intake:  Pre-visit preparation completed: Yes  Pain : No/denies pain   BMI - recorded: 28.95 Nutritional Status: BMI 25 -29 Overweight Nutritional Risks:  None  Nutrition Risk Assessment: Has the patient had any N/V/D within the last 2 months?  No Does the patient have any non-healing wounds?  No Has the patient had any unintentional weight loss or weight gain?  No  Is the patient diabetic?  Yes If diabetic, was a CBG obtained today?  No Did the patient bring in their glucometer from home?  No Comments: Pt monitors CBG's daily. Denies any financial strains with the device or supplies.  Diabetic Exams: Diabetic Eye Exam: Overdue for diabetic eye exam. Pt has been advised about the importance in completing this exam. Declined my offer to place referral for completion. States she will schedule her own appt. Diabetic Foot Exam: Overdue. Pt has been advised about the importance in completing this exam. Pt is scheduled for completion of diabetic foot exam today.  How often do you need to have someone help you when you read instructions, pamphlets, or other written materials from your doctor or pharmacy?: 1 - Never  Interpreter Needed?: No  Information entered by :: AIdell Pickles LPN  Past Medical History:  Diagnosis Date  . Arthritis   . Diabetes mellitus without complication ([place]   . Hyperlipidemia   . Hypertension   . S/P partial hysterectomy    Past Surgical History:  Procedure Laterality Date  . CHOLECYSTECTOMY    . COLONOSCOPY  2010, 2015   one polyp  . PARTIAL HYSTERECTOMY     one ovary remains   Family History  Problem Relation Age of Onset  .  Colon cancer Mother   . Diabetes Father   . Heart disease Father   . Hypertension Father   . Diabetes Maternal Grandmother   . Diabetes Paternal Grandmother    Social History   Socioeconomic History  . Marital status: Widowed    Spouse name: Not on file  . Number of children: 1  . Years of education: Not on file  . Highest education level: 12th grade  Occupational History  . Occupation: Retired  Scientific laboratory technician  . Financial resource strain: Somewhat hard  . Food insecurity:     Worry: Never true    Inability: Never true  . Transportation needs:    Medical: No    Non-medical: No  Tobacco Use  . Smoking status: Never Smoker  . Smokeless tobacco: Never Used  . Tobacco comment: smoking cessation materials not required  Substance and Sexual Activity  . Alcohol use: No  . Drug use: No  . Sexual activity: Not Currently  Lifestyle  . Physical activity:    Days per week: 5 days    Minutes per session: 50 min  . Stress: Not at all  Relationships  . Social connections:    Talks on phone: Patient refused    Gets together: Patient refused    Attends religious service: Patient refused    Active member of club or organization: Patient refused    Attends meetings of clubs or organizations: Patient refused    Relationship status: Widowed  Other Topics Concern  . Not on file  Social History Narrative  . Not on file    Outpatient Encounter Medications as of 04/12/2018  Medication Sig  . Blood Glucose Monitoring Suppl (FREESTYLE FREEDOM LITE) w/Device KIT Use to check BS TWICE a day. DX Code: DM2: E11.9  . glipiZIDE (GLUCOTROL) 5 MG tablet Take 1 tablet (5 mg total) by mouth daily before breakfast.  . glucose blood test strip Use to check Blood Sugar once daily with Freestyle Lite for DX:E11.4 DM2  . Lancets (ONETOUCH ULTRASOFT) lancets Use to check BS twice daily DX: ICD 10 E11.9  . lisinopril (PRINIVIL,ZESTRIL) 20 MG tablet TAKE 1 TABLET BY MOUTH DAILY  . metFORMIN (GLUCOPHAGE) 1000 MG tablet Take 1 tablet (1,000 mg total) by mouth 2 (two) times daily with a meal. (Patient taking differently: Take 500 mg by mouth daily after supper. )  . aspirin 81 MG chewable tablet Chew 1 tablet by mouth daily.  . Cholecalciferol (VITAMIN D3) 2000 units TABS Take 1 tablet by mouth daily. (Patient not taking: Reported on 04/12/2018)  . Magnesium 250 MG TABS Take by mouth daily.  . Multiple Vitamins-Minerals (MULTIVITAMIN WITH MINERALS) tablet Take 1 tablet by mouth daily.  .  TURMERIC PO Take by mouth.  . vitamin B-12 (CYANOCOBALAMIN) 1000 MCG tablet Take 0.5 tablets (500 mcg total) by mouth daily. (Patient not taking: Reported on 04/12/2018)   No facility-administered encounter medications on file as of 04/12/2018.     Activities of Daily Living In your present state of health, do you have any difficulty performing the following activities: 04/12/2018 08/04/2017  Hearing? N N  Comment denies hearing aids -  Vision? N N  Comment wears eyeglasses -  Difficulty concentrating or making decisions? N N  Walking or climbing stairs? N N  Dressing or bathing? N N  Doing errands, shopping? N N  Preparing Food and eating ? N -  Comment denies dentures -  Using the Toilet? N -  In the past six months, have  you accidently leaked urine? N -  Do you have problems with loss of bowel control? N -  Managing your Medications? N -  Managing your Finances? N -  Housekeeping or managing your Housekeeping? N -  Some recent data might be hidden    Patient Care Team: Juline Patch, MD as PCP - General (Family Medicine)    Assessment:   This is a routine wellness examination for [place].  Exercise Activities and Dietary recommendations Current Exercise Habits: Home exercise routine, Type of exercise: strength training/weights, Time (Minutes): 50, Frequency (Times/Week): 5, Weekly Exercise (Minutes/Week): 250, Intensity: Mild, Exercise limited by: None identified  Goals    . DIET - INCREASE WATER INTAKE     Recommend to drink at least 6-8 8oz glasses of water per day.       Fall Risk Fall Risk  04/12/2018 11/09/2017 12/04/2016 11/06/2016 04/02/2016  Falls in the past year? Yes No (No Data) No No  Comment - - no falls since previous visit. - -  Number falls in past yr: 1 - - - -  Injury with Fall? No - - - -  Risk for fall due to : Impaired vision;Impaired balance/gait;History of fall(s) - - Impaired balance/gait -  Risk for fall due to: Comment wears eyeglasses;  ambulates with cane - - - -  Follow up Falls evaluation completed;Education provided;Falls prevention discussed - - - -   FALL RISK PREVENTION PERTAINING TO HOME: Is your home free of loose throw rugs in walkways, pet beds, electrical cords, etc? Yes Is there adequate lighting in your home to reduce risk of falls?  Yes Are there stairs in or around your home WITH handrails? Yes  ASSISTIVE DEVICES UTILIZED TO PREVENT FALLS: Use of a cane, walker or w/c? Yes, cane and/or w/c Grab bars in the bathroom? No  Shower chair or a place to sit while bathing? Yes An elevated toilet seat or a handicapped toilet? Yes  Timed Get Up and Go Performed: Yes. Pt ambulated 10 feet within 20 sec. Gait slow, steady and with the use of an assistive device. No intervention required at this time. Fall risk prevention has been discussed.  Community Resource Referral:  Data processing manager Referral sent to [employer] for installation of grab bars in the shower.   Depression Screen PHQ 2/9 Scores 04/12/2018 11/09/2017 11/06/2016 04/02/2016  PHQ - 2 Score 0 0 0 0  PHQ- 9 Score 0 - - -     Cognitive Function     6CIT Screen 04/12/2018  What Year? 0 points  What month? 0 points  What time? 0 points  Count back from 20 0 points  Months in reverse 0 points  Repeat phrase 2 points  Total Score 2    Immunization History  Administered Date(s) Administered  . Tdap 07/11/2015    Qualifies for Shingles Vaccine? Yes. Due for Shingrix. Education has been provided regarding the importance of this vaccine. Pt has been advised to call insurance company to determine out of pocket expense. Advised may also receive vaccine at local pharmacy or Health Dept. Verbalized acceptance and understanding.  Overdue for Flu vaccine. Education has been provided regarding the importance of this vaccine and advised to receive when available. Verbalized acceptance and understanding.  Due for Pneumoccocal vaccine. Declined my offer to  administer today. Education has been provided regarding the importance of this vaccine but still declined. Advised may receive this vaccine at local pharmacy or Health Dept. Aware to provide a copy  of the vaccination record if obtained from local pharmacy or Health Dept. Verbalized acceptance and understanding.  Screening Tests Health Maintenance  Topic Date Due  . FOOT EXAM  06/18/1951  . OPHTHALMOLOGY EXAM  06/13/2018 (Originally 11/05/2017)  . INFLUENZA VACCINE  06/24/2018 (Originally 04/15/2018)  . PNA vac Low Risk Adult (1 of 2 - PCV13) 04/13/2019 (Originally 06/18/2004)  . HEMOGLOBIN A1C  06/12/2018  . COLONOSCOPY  07/31/2019  . TETANUS/TDAP  07/10/2025  . DEXA SCAN  Addressed    Cancer Screenings: Lung: Low Dose CT Chest recommended if Age 5-80 years, 30 pack-year currently smoking OR have quit w/in 15years. Patient does not qualify. Breast Screening: No longer required  Up to date of Bone Density/Dexa? No. Completed 03/22/09. Repeat every 2 years. Declined my offer to order this screening. Education provided re: importance of this screening but still declined. Colorectal: Completed 07/30/14. Repeat every 5 years.  Additional Screenings: Hepatitis C Screening: Does not qualify    Plan:  I have personally reviewed and addressed the Medicare Annual Wellness questionnaire and have noted the following in the patient's chart:  A. Medical and social history B. Use of alcohol, tobacco or illicit drugs  C. Current medications and supplements D. Functional ability and status E.  Nutritional status F.  Physical activity G. Advance directives H. List of other physicians I.  Hospitalizations, surgeries, and ER visits in previous 12 months J.  [place] such as hearing and vision if needed, cognitive and depression L. Referrals and appointments  In addition, I have reviewed and discussed with patient certain preventive protocols, quality metrics, and best practice  recommendations. A written personalized care plan for preventive services as well as general preventive health recommendations were provided to patient.  Signed,  Aleatha Borer, LPN Nurse Health Advisor  MD Recommendations: Due for Shingrix. Education has been provided regarding the importance of this vaccine. Pt has been advised to call insurance company to determine out of pocket expense. Advised may also receive vaccine at local pharmacy or Health Dept. Verbalized acceptance and understanding.  Overdue for Flu vaccine. Education has been provided regarding the importance of this vaccine and advised to receive when available. Verbalized acceptance and understanding.  Due for Pneumoccocal vaccine. Declined my offer to administer today. Education has been provided regarding the importance of this vaccine but still declined. Advised may receive this vaccine at local pharmacy or Health Dept. Aware to provide a copy of the vaccination record if obtained from local pharmacy or Health Dept. Verbalized acceptance and understanding.  Bone Density/Dexa: Completed 03/22/09. Repeat every 2 years. Declined my offer to order this screening. Education provided re: importance of this screening but still declined.  Diabetic Eye Exam: Overdue for diabetic eye exam. Pt has been advised about the importance in completing this exam. Declined my offer to place referral for completion. States she will schedule her own appt.  Diabetic Foot Exam: Overdue. Pt has been advised about the importance in completing this exam. Pt is scheduled for completion of diabetic foot exam today.

## 2018-04-12 NOTE — Assessment & Plan Note (Signed)
Draw a lipid panel/ pt refuses treatment at this time/ discuss further after lab results come in

## 2018-04-12 NOTE — Assessment & Plan Note (Signed)
Recheck renal panel/ advised to take diabetes medications as prescribed/ continue Lisinopril for kidney protection

## 2018-04-12 NOTE — Progress Notes (Signed)
Name: Betty Ibarra   MRN: [id number]    DOB: 1941/02/26   Date:04/12/2018       Progress Note  Subjective  Chief Complaint  Chief Complaint  Patient presents with  . Hypertension  . Diabetes    is only taking 1,06m at night. Supposed to take 1,0086min am and 50056mt night  . Hyperlipidemia    refuses med, but last lab/ TG was over 400    Hypertension  This is a chronic problem. The current episode started more than 1 year ago. The problem has been gradually improving since onset. The problem is controlled. Pertinent negatives include no anxiety, blurred vision, chest pain, headaches, malaise/fatigue, neck pain, orthopnea, palpitations, peripheral edema, PND, shortness of breath or sweats. There are no associated agents to hypertension. Risk factors for coronary artery disease include dyslipidemia and diabetes mellitus. Past treatments include ACE inhibitors. The current treatment provides moderate improvement. There are no compliance problems.  There is no history of angina, kidney disease, CAD/MI, CVA, heart failure, left ventricular hypertrophy, PVD or retinopathy. There is no history of chronic renal disease, a hypertension causing med or renovascular disease.  Diabetes  She presents for her follow-up diabetic visit. She has type 2 diabetes mellitus. Her disease course has been fluctuating. Pertinent negatives for hypoglycemia include no confusion, dizziness, headaches, hunger, mood changes, nervousness/anxiousness, pallor, seizures, sleepiness, speech difficulty, sweats or tremors. Associated symptoms include polydipsia and polyuria. Pertinent negatives for diabetes include no blurred vision, no chest pain, no fatigue, no foot paresthesias, no foot ulcerations, no polyphagia, no visual change, no weakness and no weight loss. There are no hypoglycemic complications. Symptoms are stable. There are no diabetic complications. Pertinent negatives for diabetic complications include no CVA, PVD  or retinopathy. Risk factors for coronary artery disease include dyslipidemia, diabetes mellitus and hypertension. Current diabetic treatment includes oral agent (dual therapy). Her weight is stable. She is following a generally healthy diet. Her home blood glucose trend is fluctuating minimally. An ACE inhibitor/angiotensin II receptor blocker is being taken. She does not see a podiatrist.Eye exam is not current.  Hyperlipidemia  This is a chronic problem. The current episode started more than 1 year ago. The problem is uncontrolled. Recent lipid tests were reviewed and are normal. Exacerbating diseases include diabetes. She has no history of chronic renal disease. Pertinent negatives include no chest pain, focal sensory loss, focal weakness, leg pain, myalgias or shortness of breath. Current antihyperlipidemic treatment includes statins. The current treatment provides moderate improvement of lipids. Risk factors for coronary artery disease include dyslipidemia, diabetes mellitus and hypertension.    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 Arthritis   . Diabetes mellitus without complication (HCC)[place]. Hyperlipidemia   . Hypertension   . S/P partial hysterectomy     Past Surgical History:  Procedure Laterality Date  . CHOLECYSTECTOMY    . COLONOSCOPY  2010, 2015   one polyp  . PARTIAL HYSTERECTOMY  one ovary remains    Family History  Problem Relation Age of Onset  . Colon cancer Mother   . Diabetes Father   . Heart disease Father   . Hypertension Father   . Diabetes Maternal Grandmother   . Diabetes Paternal Grandmother     Social History   Socioeconomic History  . Marital status: Widowed    Spouse name: Not on file  . Number of children: 1  . Years of education: Not on file  . Highest education level: 12th grade  Occupational History  . Occupation: Retired  Scientific laboratory technician  . Financial resource strain: Somewhat hard  . Food insecurity:    Worry: Never true    Inability: Never true  . Transportation needs:    Medical: No    Non-medical: No  Tobacco Use  . Smoking status: Never Smoker  . Smokeless tobacco: Never Used  . Tobacco comment: smoking cessation materials not required  Substance and Sexual Activity  . Alcohol use: No  . Drug use: No  . Sexual activity: Not Currently  Lifestyle  . Physical activity:    Days per week: 5 days    Minutes per session: 50 min  . Stress: Not at all  Relationships  . Social connections:    Talks on phone: Patient refused    Gets together: Patient refused    Attends religious service: Patient refused    Active member of club or organization: Patient refused    Attends meetings of clubs or organizations: Patient refused    Relationship status: Widowed  . Intimate partner violence:    Fear of current or ex partner: No    Emotionally abused: No    Physically abused: No    Forced sexual activity: No  Other Topics Concern  . Not on file  Social History Narrative  . Not on file    Allergies  Allergen Reactions  . Sulfa Antibiotics Itching and Rash  . Sulfasalazine Itching and Rash  . Amoxicillin Rash  . Penicillins Rash    Outpatient Medications Prior to Visit  Medication Sig Dispense Refill  . aspirin 81 MG chewable tablet Chew 1 tablet by mouth daily.  0  . Blood Glucose Monitoring Suppl (FREESTYLE FREEDOM  LITE) w/Device KIT Use to check BS TWICE a day. DX Code: DM2: E11.9 1 each 0  . glucose blood test strip Use to check Blood Sugar once daily with Freestyle Lite for DX:E11.4 DM2 50 each 12  . Lancets (ONETOUCH ULTRASOFT) lancets Use to check BS twice daily DX: ICD 10 E11.9 100 each 12  . Magnesium 250 MG TABS Take by mouth daily.    . Multiple Vitamins-Minerals (MULTIVITAMIN WITH MINERALS) tablet Take 1 tablet by mouth daily.    . TURMERIC PO Take by mouth.    [place] Kitchen glipiZIDE (GLUCOTROL) 5 MG tablet Take 1 tablet (5 mg total) by mouth daily before breakfast.    . lisinopril (PRINIVIL,ZESTRIL) 20 MG tablet TAKE 1 TABLET BY MOUTH DAILY 90 tablet 0  . metFORMIN (GLUCOPHAGE) 1000 MG tablet Take 1 tablet (1,000 mg total) by mouth 2 (two) times daily with a meal. (Patient taking differently: Take 1,000 mg by mouth at bedtime. ) 180 tablet 3  . Cholecalciferol (VITAMIN D3) 2000 units TABS Take 1 tablet by mouth daily. (Patient not taking: Reported on 04/12/2018) 30 tablet   . vitamin B-12 (CYANOCOBALAMIN) 1000 MCG tablet Take 0.5 tablets (500 mcg total) by mouth daily. (Patient not taking: Reported on 04/12/2018) 30 tablet  0   No facility-administered medications prior to visit.     Review of Systems  Constitutional: Negative for chills, fatigue, fever, malaise/fatigue and weight loss.  HENT: Negative for ear discharge, ear pain and sore throat.   Eyes: Negative for blurred vision.  Respiratory: Negative for cough, sputum production, shortness of breath and wheezing.   Cardiovascular: Negative for chest pain, palpitations, orthopnea, leg swelling and PND.  Gastrointestinal: Negative for abdominal pain, blood in stool, constipation, diarrhea, heartburn, melena and nausea.  Genitourinary: Negative for dysuria, frequency, hematuria and urgency.  Musculoskeletal: Negative for back pain, joint pain, myalgias and neck pain.  Skin: Negative for pallor and rash.  Neurological: Negative for dizziness, tingling,  tremors, sensory change, focal weakness, seizures, speech difficulty, weakness and headaches.  Endo/Heme/Allergies: Positive for polydipsia. Negative for environmental allergies and polyphagia. Does not bruise/bleed easily.  Psychiatric/Behavioral: Negative for confusion, depression and suicidal ideas. The patient is not nervous/anxious and does not have insomnia.      Objective  Vitals:   04/12/18 1027  BP: (!) 142/76  Pulse: 68  Weight: 163 lb (73.9 kg)  Height: 5' 3" (1.6 m)    Physical Exam  Constitutional: She is oriented to person, place, and time. She appears well-developed and well-nourished.  HENT:  Head: Normocephalic.  Right Ear: External ear normal.  Left Ear: External ear normal.  Mouth/Throat: Oropharynx is clear and moist.  Eyes: Pupils are equal, round, and reactive to light. Conjunctivae and EOM are normal. Lids are everted and swept, no foreign bodies found. Left eye exhibits no hordeolum. No foreign body present in the left eye. Right conjunctiva is not injected. Left conjunctiva is not injected. No scleral icterus.  Neck: Normal range of motion. Neck supple. No JVD present. No tracheal deviation present. No thyromegaly present.  Cardiovascular: Normal rate, regular rhythm, normal heart sounds and intact distal pulses. Exam reveals no gallop and no friction rub.  No murmur heard. Pulmonary/Chest: Effort normal and breath sounds normal. No respiratory distress. She has no wheezes. She has no rales.  Abdominal: Soft. Bowel sounds are normal. She exhibits no mass. There is no hepatosplenomegaly. There is no tenderness. There is no rebound and no guarding.  Musculoskeletal: Normal range of motion. She exhibits no edema or tenderness.  Lymphadenopathy:    She has no cervical adenopathy.  Neurological: She is alert and oriented to person, place, and time. She has normal strength. She displays normal reflexes. No cranial nerve deficit.  Skin: Skin is warm. No rash noted.   Psychiatric: She has a normal mood and affect. Her mood appears not anxious. She does not exhibit a depressed mood.  Nursing note and vitals reviewed.     Assessment & Plan  Problem List Items Addressed This Visit      Cardiovascular and Mediastinum   Hypertension - Primary    Continue Lisinopril 75m- stable/ recheck renal panel/ RTC in 6 months pending labs      Relevant Medications   lisinopril (PRINIVIL,ZESTRIL) 20 MG tablet   Other Relevant Orders   Renal Function Panel     Endocrine   RESOLVED: Type 2 diabetes mellitus, uncontrolled, with neuropathy (HCC)    Take Metformin and Glipizide as prescribed/ monitor diet and exercise/ continue Lisinopril for kidney protection/ make change on meds pending lab results      Relevant Medications   lisinopril (PRINIVIL,ZESTRIL) 20 MG tablet   metFORMIN (GLUCOPHAGE) 1000 MG tablet   glipiZIDE (GLUCOTROL) 5 MG tablet   CKD stage 3  due to type 2 diabetes mellitus ([place])    Recheck renal panel/ advised to take diabetes medications as prescribed/ continue Lisinopril for kidney protection      Relevant Medications   lisinopril (PRINIVIL,ZESTRIL) 20 MG tablet   metFORMIN (GLUCOPHAGE) 1000 MG tablet   glipiZIDE (GLUCOTROL) 5 MG tablet     Other   Hyperlipidemia    Draw a lipid panel/ pt refuses treatment at this time/ discuss further after lab results come in      Relevant Medications   lisinopril (PRINIVIL,ZESTRIL) 20 MG tablet   Other Relevant Orders   Lipid panel    Other Visit Diagnoses    Type 2 diabetes mellitus without complication, without long-term current use of insulin (HCC)       Relevant Medications   lisinopril (PRINIVIL,ZESTRIL) 20 MG tablet   metFORMIN (GLUCOPHAGE) 1000 MG tablet   glipiZIDE (GLUCOTROL) 5 MG tablet   Other Relevant Orders   Hemoglobin A1c   Renal Function Panel   Lipid panel      Meds ordered this encounter  Medications  . lisinopril (PRINIVIL,ZESTRIL) 20 MG tablet    Sig: Take 1  tablet (20 mg total) by mouth daily.    Dispense:  90 tablet    Refill:  1    HAS APPT NEXT MONTH PLEASE SEND IN A REFILL FOR NOW THANKS RACHEL RPH  . metFORMIN (GLUCOPHAGE) 1000 MG tablet    Sig: Take 1 tablet (1,000 mg total) by mouth 2 (two) times daily with a meal. Patient to take one half twice a day    Dispense:  180 tablet    Refill:  1  . glipiZIDE (GLUCOTROL) 5 MG tablet    Sig: Take 1 tablet (5 mg total) by mouth daily before breakfast.      Dr. Jasn Xia [facility]  04/12/18

## 2018-04-12 NOTE — Assessment & Plan Note (Signed)
Take Metformin and Glipizide as prescribed/ monitor diet and exercise/ continue Lisinopril for kidney protection/ make change on meds pending lab results

## 2018-04-12 NOTE — Assessment & Plan Note (Signed)
Continue Lisinopril 20mg - stable/ recheck renal panel/ RTC in 6 months pending labs

## 2018-04-12 NOTE — Patient Instructions (Signed)
Betty Ibarra , Thank you for taking time to come for your Medicare Wellness Visit. I appreciate your ongoing commitment to your health goals. Please review the following plan we discussed and let me know if I can assist you in the future.   Screening recommendations/referrals: Colorectal Screening: Up to date Mammogram: No longer required Bone Density: Declined  Vision and Dental Exams: Recommended annual ophthalmology exams for early detection of glaucoma and other disorders of the eye Recommended annual dental exams for proper oral hygiene  Diabetic Exams: Recommended annual diabetic eye exams for early detection of retinopathy Recommended annual diabetic foot exams for early detection of peripheral neuropathy.  Diabetic Eye Exam: Please schedule an appointment with your ophthalmologist Diabetic Foot Exam: To be completed today  Vaccinations: Influenza vaccine: Overdue Pneumococcal vaccine: Declined Tdap vaccine: Up to date Shingles vaccine: Please call your insurance company to determine your out of pocket expense for the Shingrix vaccine. You may also receive this vaccine at your local pharmacy or Health Dept.    Advanced directives: We have received a copy of your POA (Power of [place]) and/or Living Will. These documents can be located in your chart.  Goals: Recommend to drink at least 6-8 8oz glasses of water per day.  Next appointment: Please schedule your Annual Wellness Visit with your Nurse Health Advisor in one year.  Preventive Care 79 Years and Older, Female Preventive care refers to lifestyle choices and visits with your health care provider that can promote health and wellness. What does preventive care include?  A yearly physical exam. This is also called an annual well check.  Dental exams once or twice a year.  Routine eye exams. Ask your health care provider how often you should have your eyes checked.  Personal lifestyle choices, including:  Daily care  of your teeth and gums.  Regular physical activity.  Eating a healthy diet.  Avoiding tobacco and drug use.  Limiting alcohol use.  Practicing safe sex.  Taking low-dose aspirin every day.  Taking vitamin and mineral supplements as recommended by your health care provider. What happens during an annual well check? The services and screenings done by your health care provider during your annual well check will depend on your age, overall health, lifestyle risk factors, and family history of disease. Counseling  Your health care provider may ask you questions about your:  Alcohol use.  Tobacco use.  Drug use.  Emotional well-being.  Home and relationship well-being.  Sexual activity.  Eating habits.  History of falls.  Memory and ability to understand (cognition).  Work and work Statistician.  Reproductive health. Screening  You may have the following tests or measurements:  Height, weight, and BMI.  Blood pressure.  Lipid and cholesterol levels. These may be checked every 5 years, or more frequently if you are over 24 years old.  Skin check.  Lung cancer screening. You may have this screening every year starting at age 79 if you have a 30-pack-year history of smoking and currently smoke or have quit within the past 15 years.  Fecal occult blood test (FOBT) of the stool. You may have this test every year starting at age 15.  Flexible sigmoidoscopy or colonoscopy. You may have a sigmoidoscopy every 5 years or a colonoscopy every 10 years starting at age 45.  Hepatitis C blood test.  Hepatitis B blood test.  Sexually transmitted disease (STD) testing.  Diabetes screening. This is done by checking your blood sugar (glucose) after you have not  eaten for a while (fasting). You may have this done every 1-3 years.  Bone density scan. This is done to screen for osteoporosis. You may have this done starting at age 79.  Mammogram. This may be done every 1-2  years. Talk to your health care provider about how often you should have regular mammograms. Talk with your health care provider about your test results, treatment options, and if necessary, the need for more tests. Vaccines  Your health care provider may recommend certain vaccines, such as:  Influenza vaccine. This is recommended every year.  Tetanus, diphtheria, and acellular pertussis (Tdap, Td) vaccine. You may need a Td booster every 10 years.  Zoster vaccine. You may need this after age 79.  Pneumococcal 13-valent conjugate (PCV13) vaccine. One dose is recommended after age 79.  Pneumococcal polysaccharide (PPSV23) vaccine. One dose is recommended after age 79. Talk to your health care provider about which screenings and vaccines you need and how often you need them. This information is not intended to replace advice given to you by your health care provider. Make sure you discuss any questions you have with your health care provider. Document Released: 09/28/2015 Document Revised: 05/21/2016 Document Reviewed: 07/03/2015 Elsevier Interactive Patient Education  2017 [place] Prevention in the Home Falls can cause injuries. They can happen to people of all ages. There are many things you can do to make your home safe and to help prevent falls. What can I do on the outside of my home?  Regularly fix the edges of walkways and driveways and fix any cracks.  Remove anything that might make you trip as you walk through a door, such as a raised step or threshold.  Trim any bushes or trees on the path to your home.  Use bright outdoor lighting.  Clear any walking paths of anything that might make someone trip, such as rocks or tools.  Regularly check to see if handrails are loose or broken. Make sure that both sides of any steps have handrails.  Any raised decks and porches should have guardrails on the edges.  Have any leaves, snow, or ice cleared regularly.  Use  sand or salt on walking paths during winter.  Clean up any spills in your garage right away. This includes oil or grease spills. What can I do in the bathroom?  Use night lights.  Install grab bars by the toilet and in the tub and shower. Do not use towel bars as grab bars.  Use non-skid mats or decals in the tub or shower.  If you need to sit down in the shower, use a plastic, non-slip stool.  Keep the floor dry. Clean up any water that spills on the floor as soon as it happens.  Remove soap buildup in the tub or shower regularly.  Attach bath mats securely with double-sided non-slip rug tape.  Do not have throw rugs and other things on the floor that can make you trip. What can I do in the bedroom?  Use night lights.  Make sure that you have a light by your bed that is easy to reach.  Do not use any sheets or blankets that are too big for your bed. They should not hang down onto the floor.  Have a firm chair that has side arms. You can use this for support while you get dressed.  Do not have throw rugs and other things on the floor that can make you trip. What can I  do in the kitchen?  Clean up any spills right away.  Avoid walking on wet floors.  Keep items that you use a lot in easy-to-reach places.  If you need to reach something above you, use a strong step stool that has a grab bar.  Keep electrical cords out of the way.  Do not use floor polish or wax that makes floors slippery. If you must use wax, use non-skid floor wax.  Do not have throw rugs and other things on the floor that can make you trip. What can I do with my stairs?  Do not leave any items on the stairs.  Make sure that there are handrails on both sides of the stairs and use them. Fix handrails that are broken or loose. Make sure that handrails are as long as the stairways.  Check any carpeting to make sure that it is firmly attached to the stairs. Fix any carpet that is loose or worn.  Avoid  having throw rugs at the top or bottom of the stairs. If you do have throw rugs, attach them to the floor with carpet tape.  Make sure that you have a light switch at the top of the stairs and the bottom of the stairs. If you do not have them, ask someone to add them for you. What else can I do to help prevent falls?  Wear shoes that:  Do not have high heels.  Have rubber bottoms.  Are comfortable and fit you well.  Are closed at the toe. Do not wear sandals.  If you use a stepladder:  Make sure that it is fully opened. Do not climb a closed stepladder.  Make sure that both sides of the stepladder are locked into place.  Ask someone to hold it for you, if possible.  Clearly mark and make sure that you can see:  Any grab bars or handrails.  First and last steps.  Where the edge of each step is.  Use tools that help you move around (mobility aids) if they are needed. These include:  Canes.  Walkers.  Scooters.  Crutches.  Turn on the lights when you go into a dark area. Replace any light bulbs as soon as they burn out.  Set up your furniture so you have a clear path. Avoid moving your furniture around.  If any of your floors are uneven, fix them.  If there are any pets around you, be aware of where they are.  Review your medicines with your doctor. Some medicines can make you feel dizzy. This can increase your chance of falling. Ask your doctor what other things that you can do to help prevent falls. This information is not intended to replace advice given to you by your health care provider. Make sure you discuss any questions you have with your health care provider. Document Released: 06/28/2009 Document Revised: 02/07/2016 Document Reviewed: 10/06/2014 Elsevier Interactive Patient Education  2017 [employer].

## 2018-04-13 LAB — RENAL FUNCTION PANEL
Albumin: 4.2 g/dL (ref 3.5–4.8)
BUN / CREAT RATIO: 18 (ref 12–28)
BUN: 17 mg/dL (ref 8–27)
CALCIUM: 9.5 mg/dL (ref 8.7–10.3)
CHLORIDE: 95 mmol/L — AB (ref 96–106)
CO2: 22 mmol/L (ref 20–29)
Creatinine, Ser: 0.96 mg/dL (ref 0.57–1.00)
GFR calc non Af Amer: 57 mL/min/{1.73_m2} — ABNORMAL LOW (ref >59)
GFR, EST AFRICAN AMERICAN: 66 mL/min/{1.73_m2} (ref >59)
GLUCOSE: 193 mg/dL — AB (ref 65–99)
Phosphorus: 3.8 mg/dL (ref 2.5–4.5)
Potassium: 5.1 mmol/L (ref 3.5–5.2)
SODIUM: 134 mmol/L (ref 134–144)

## 2018-04-13 LAB — LIPID PANEL
Chol/HDL Ratio: 6.7 ratio — ABNORMAL HIGH (ref 0.0–4.4)
Cholesterol, Total: 287 mg/dL — ABNORMAL HIGH (ref 100–199)
HDL: 43 mg/dL (ref >39)
Triglycerides: 606 mg/dL (ref 0–149)

## 2018-04-13 LAB — HEMOGLOBIN A1C
ESTIMATED AVERAGE GLUCOSE: 237 mg/dL
Hgb A1c MFr Bld: 9.9 % — ABNORMAL HIGH (ref 4.8–5.6)

## 2018-04-14 ENCOUNTER — Other Ambulatory Visit: Payer: Self-pay

## 2018-04-14 ENCOUNTER — Telehealth: Payer: Self-pay

## 2018-04-14 DIAGNOSIS — E119 Type 2 diabetes mellitus without complications: Secondary | ICD-10-CM

## 2018-04-14 NOTE — Telephone Encounter (Signed)
Called to discuss Endo with patient and went over risks of 9.9 A1C as well as risks of Kidney and Heart Damage. Patient responded to my recommendation  saying she will continue to do things the way she is and that is to change meds however and whenever . She will either continue Metformin 1000 at night or maybe split it up or do Glipizide twice. Patient would not agree to any doctors instructions and declined Endo even said Jardiance causes patients to have amputations. I corrected her and stated Diabetes is the causes of amputation. Patient aware of risks in self medicating as well as the FACT she needs another medication added to hers. Refusing to comply.

## 2018-05-21 ENCOUNTER — Telehealth: Payer: Self-pay

## 2018-05-21 NOTE — Telephone Encounter (Signed)
I called pt today to her know she needs to get in touch with endocrinology. They said they have been trying to reach her by phone and mailed a letter. Pt has not responded. She will need to get in with endocrinology due to noncompliance with her meds. I left a message stating for her to call endocrin and provided the number for her to call them.

## 2018-07-14 DIAGNOSIS — N183 Chronic kidney disease, stage 3 (moderate): Secondary | ICD-10-CM | POA: Diagnosis not present

## 2018-07-14 DIAGNOSIS — I10 Essential (primary) hypertension: Secondary | ICD-10-CM | POA: Diagnosis not present

## 2018-07-14 DIAGNOSIS — Z7689 Persons encountering health services in other specified circumstances: Secondary | ICD-10-CM | POA: Diagnosis not present

## 2018-07-14 DIAGNOSIS — E119 Type 2 diabetes mellitus without complications: Secondary | ICD-10-CM | POA: Diagnosis not present

## 2018-07-14 DIAGNOSIS — E782 Mixed hyperlipidemia: Secondary | ICD-10-CM | POA: Diagnosis not present

## 2018-07-14 DIAGNOSIS — E1122 Type 2 diabetes mellitus with diabetic chronic kidney disease: Secondary | ICD-10-CM | POA: Diagnosis not present

## 2018-08-27 DIAGNOSIS — H2512 Age-related nuclear cataract, left eye: Secondary | ICD-10-CM | POA: Diagnosis not present

## 2018-08-27 DIAGNOSIS — E113293 Type 2 diabetes mellitus with mild nonproliferative diabetic retinopathy without macular edema, bilateral: Secondary | ICD-10-CM | POA: Diagnosis not present

## 2018-08-27 DIAGNOSIS — Z961 Presence of intraocular lens: Secondary | ICD-10-CM | POA: Diagnosis not present

## 2018-08-27 DIAGNOSIS — H353221 Exudative age-related macular degeneration, left eye, with active choroidal neovascularization: Secondary | ICD-10-CM | POA: Diagnosis not present

## 2018-10-01 ENCOUNTER — Ambulatory Visit: Payer: Medicare Other | Admitting: Family Medicine

## 2018-10-26 DIAGNOSIS — E119 Type 2 diabetes mellitus without complications: Secondary | ICD-10-CM | POA: Diagnosis not present

## 2018-10-26 DIAGNOSIS — R0601 Orthopnea: Secondary | ICD-10-CM | POA: Diagnosis not present

## 2018-10-26 DIAGNOSIS — Z1159 Encounter for screening for other viral diseases: Secondary | ICD-10-CM | POA: Diagnosis not present

## 2018-10-26 DIAGNOSIS — I10 Essential (primary) hypertension: Secondary | ICD-10-CM | POA: Diagnosis not present

## 2018-10-26 DIAGNOSIS — E785 Hyperlipidemia, unspecified: Secondary | ICD-10-CM | POA: Diagnosis not present

## 2018-11-18 ENCOUNTER — Other Ambulatory Visit: Payer: Self-pay | Admitting: Family Medicine

## 2018-11-18 DIAGNOSIS — I10 Essential (primary) hypertension: Secondary | ICD-10-CM

## 2018-11-25 ENCOUNTER — Other Ambulatory Visit: Payer: Self-pay

## 2018-11-25 DIAGNOSIS — I10 Essential (primary) hypertension: Secondary | ICD-10-CM

## 2018-11-25 MED ORDER — LISINOPRIL 20 MG PO TABS: 20.0000 mg | ORAL_TABLET | Freq: Every day | ORAL | 0 refills | Status: AC

## 2019-01-19 DIAGNOSIS — I10 Essential (primary) hypertension: Secondary | ICD-10-CM | POA: Diagnosis not present

## 2019-01-19 DIAGNOSIS — E119 Type 2 diabetes mellitus without complications: Secondary | ICD-10-CM | POA: Diagnosis not present

## 2019-02-17 DIAGNOSIS — I10 Essential (primary) hypertension: Secondary | ICD-10-CM | POA: Diagnosis not present

## 2019-02-17 DIAGNOSIS — E119 Type 2 diabetes mellitus without complications: Secondary | ICD-10-CM | POA: Diagnosis not present

## 2019-03-29 DIAGNOSIS — I10 Essential (primary) hypertension: Secondary | ICD-10-CM | POA: Diagnosis not present

## 2019-03-29 DIAGNOSIS — E119 Type 2 diabetes mellitus without complications: Secondary | ICD-10-CM | POA: Diagnosis not present

## 2019-04-29 DIAGNOSIS — L821 Other seborrheic keratosis: Secondary | ICD-10-CM | POA: Diagnosis not present

## 2019-04-29 DIAGNOSIS — Z85828 Personal history of other malignant neoplasm of skin: Secondary | ICD-10-CM | POA: Diagnosis not present

## 2019-04-29 DIAGNOSIS — D2339 Other benign neoplasm of skin of other parts of face: Secondary | ICD-10-CM | POA: Diagnosis not present

## 2019-04-29 DIAGNOSIS — E119 Type 2 diabetes mellitus without complications: Secondary | ICD-10-CM | POA: Diagnosis not present

## 2019-04-29 DIAGNOSIS — L308 Other specified dermatitis: Secondary | ICD-10-CM | POA: Diagnosis not present

## 2019-04-29 DIAGNOSIS — Z08 Encounter for follow-up examination after completed treatment for malignant neoplasm: Secondary | ICD-10-CM | POA: Diagnosis not present

## 2019-04-29 DIAGNOSIS — I10 Essential (primary) hypertension: Secondary | ICD-10-CM | POA: Diagnosis not present

## 2019-04-29 DIAGNOSIS — L570 Actinic keratosis: Secondary | ICD-10-CM | POA: Diagnosis not present

## 2019-04-29 DIAGNOSIS — L720 Epidermal cyst: Secondary | ICD-10-CM | POA: Diagnosis not present

## 2019-05-10 DIAGNOSIS — E119 Type 2 diabetes mellitus without complications: Secondary | ICD-10-CM | POA: Diagnosis not present

## 2019-05-10 DIAGNOSIS — R300 Dysuria: Secondary | ICD-10-CM | POA: Diagnosis not present

## 2019-05-10 DIAGNOSIS — I10 Essential (primary) hypertension: Secondary | ICD-10-CM | POA: Diagnosis not present

## 2019-05-10 DIAGNOSIS — Z1389 Encounter for screening for other disorder: Secondary | ICD-10-CM | POA: Diagnosis not present

## 2019-05-25 DIAGNOSIS — E119 Type 2 diabetes mellitus without complications: Secondary | ICD-10-CM | POA: Diagnosis not present

## 2019-05-25 DIAGNOSIS — I10 Essential (primary) hypertension: Secondary | ICD-10-CM | POA: Diagnosis not present

## 2019-05-25 DIAGNOSIS — E785 Hyperlipidemia, unspecified: Secondary | ICD-10-CM | POA: Diagnosis not present

## 2019-06-02 DIAGNOSIS — E119 Type 2 diabetes mellitus without complications: Secondary | ICD-10-CM | POA: Diagnosis not present

## 2019-06-02 DIAGNOSIS — I10 Essential (primary) hypertension: Secondary | ICD-10-CM | POA: Diagnosis not present

## 2019-06-17 DIAGNOSIS — I10 Essential (primary) hypertension: Secondary | ICD-10-CM | POA: Diagnosis not present

## 2019-06-17 DIAGNOSIS — E119 Type 2 diabetes mellitus without complications: Secondary | ICD-10-CM | POA: Diagnosis not present

## 2019-08-04 DIAGNOSIS — I10 Essential (primary) hypertension: Secondary | ICD-10-CM | POA: Diagnosis not present

## 2019-08-04 DIAGNOSIS — E119 Type 2 diabetes mellitus without complications: Secondary | ICD-10-CM | POA: Diagnosis not present

## 2019-10-27 DIAGNOSIS — E119 Type 2 diabetes mellitus without complications: Secondary | ICD-10-CM | POA: Diagnosis not present

## 2019-10-27 DIAGNOSIS — I10 Essential (primary) hypertension: Secondary | ICD-10-CM | POA: Diagnosis not present
